# Patient Record
Sex: Male | Born: 2000 | Race: White | Hispanic: No | Marital: Single | Smoking: Current every day smoker
Health system: Southern US, Community
[De-identification: ages and names within clinical notes are randomized; demographics above are authoritative.]

## PROBLEM LIST (undated history)

## (undated) DIAGNOSIS — F1990 Other psychoactive substance use, unspecified, uncomplicated: Secondary | ICD-10-CM

## (undated) HISTORY — PX: TONSILLECTOMY: SUR1361

---
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
## 2022-01-21 ENCOUNTER — Emergency Department (HOSPITAL_BASED_OUTPATIENT_CLINIC_OR_DEPARTMENT_OTHER): Payer: Self-pay | Admitting: Radiology

## 2022-01-21 ENCOUNTER — Other Ambulatory Visit: Payer: Self-pay

## 2022-01-21 ENCOUNTER — Encounter (HOSPITAL_BASED_OUTPATIENT_CLINIC_OR_DEPARTMENT_OTHER): Payer: Self-pay | Admitting: Emergency Medicine

## 2022-01-21 ENCOUNTER — Emergency Department (HOSPITAL_BASED_OUTPATIENT_CLINIC_OR_DEPARTMENT_OTHER): Payer: Self-pay

## 2022-01-21 ENCOUNTER — Emergency Department (HOSPITAL_COMMUNITY)
Admission: EM | Admit: 2022-01-21 | Discharge: 2022-01-21 | Disposition: A | Discharge status: Home / Self Care | Payer: Self-pay | Source: Home / Self Care

## 2022-01-21 ENCOUNTER — Emergency Department (HOSPITAL_BASED_OUTPATIENT_CLINIC_OR_DEPARTMENT_OTHER)
Admission: EM | Admit: 2022-01-21 | Discharge: 2022-01-21 | Disposition: A | Discharge status: Home / Self Care | Payer: Self-pay | Attending: Emergency Medicine | Admitting: Emergency Medicine

## 2022-01-21 DIAGNOSIS — S0121XA Laceration without foreign body of nose, initial encounter: Secondary | ICD-10-CM | POA: Insufficient documentation

## 2022-01-21 DIAGNOSIS — M542 Cervicalgia: Secondary | ICD-10-CM | POA: Diagnosis not present

## 2022-01-21 DIAGNOSIS — M25561 Pain in right knee: Secondary | ICD-10-CM | POA: Diagnosis not present

## 2022-01-21 DIAGNOSIS — M25572 Pain in left ankle and joints of left foot: Secondary | ICD-10-CM | POA: Insufficient documentation

## 2022-01-21 DIAGNOSIS — S0181XA Laceration without foreign body of other part of head, initial encounter: Secondary | ICD-10-CM | POA: Insufficient documentation

## 2022-01-21 DIAGNOSIS — S0990XA Unspecified injury of head, initial encounter: Secondary | ICD-10-CM | POA: Diagnosis present

## 2022-01-21 DIAGNOSIS — M25562 Pain in left knee: Secondary | ICD-10-CM | POA: Insufficient documentation

## 2022-01-21 DIAGNOSIS — Y92410 Unspecified street and highway as the place of occurrence of the external cause: Secondary | ICD-10-CM | POA: Insufficient documentation

## 2022-01-21 DIAGNOSIS — M25571 Pain in right ankle and joints of right foot: Secondary | ICD-10-CM | POA: Insufficient documentation

## 2022-01-21 LAB — CBC
HCT: 41.4 % (ref 39.0–52.0)
Hemoglobin: 14.1 g/dL (ref 13.0–17.0)
MCH: 30.9 pg (ref 26.0–34.0)
MCHC: 34.1 g/dL (ref 30.0–36.0)
MCV: 90.6 fL (ref 80.0–100.0)
Platelets: 292 10*3/uL (ref 150–400)
RBC: 4.57 MIL/uL (ref 4.22–5.81)
RDW: 12.4 % (ref 11.5–15.5)
WBC: 14.2 10*3/uL — ABNORMAL HIGH (ref 4.0–10.5)
nRBC: 0 % (ref 0.0–0.2)

## 2022-01-21 LAB — BASIC METABOLIC PANEL
Anion gap: 9 (ref 5–15)
BUN: 6 mg/dL (ref 6–20)
CO2: 29 mmol/L (ref 22–32)
Calcium: 9.2 mg/dL (ref 8.9–10.3)
Chloride: 104 mmol/L (ref 98–111)
Creatinine, Ser: 0.81 mg/dL (ref 0.61–1.24)
GFR, Estimated: >60 mL/min (ref >60)
Glucose, Bld: 92 mg/dL (ref 70–99)
Potassium: 3.8 mmol/L (ref 3.5–5.1)
Sodium: 142 mmol/L (ref 135–145)

## 2022-01-21 MED ORDER — BACITRACIN ZINC 500 UNIT/GM EX OINT
TOPICAL_OINTMENT | Freq: Two times a day (BID) | CUTANEOUS | Status: DC
  Administered 2022-01-21: 1 via TOPICAL

## 2022-01-21 MED ORDER — LIDOCAINE HCL (PF) 1 % IJ SOLN
5.0000 mL | Freq: Once | INTRAMUSCULAR | Status: AC
  Administered 2022-01-21: 5 mL
  Filled 2022-01-21: qty 5

## 2022-01-21 NOTE — Discharge Instructions (Addendum)
At this time there does not appear to be the presence of an emergent medical condition, however there is always the potential for conditions to change. Please read and follow the below instructions.  Please return to the Emergency Department immediately for any new or worsening symptoms. Please be sure to follow up with your Primary Care Provider within one week regarding your visit today; please call their office to schedule an appointment even if you are feeling better for a follow-up visit. The stitches in your face will need to be removed in the next 7 days.  You may have them removed at an urgent care, your primary care doctor's office, by the plastic surgeon or here at the emergency department. Please call the on-call orthopedist Dr. August Saucer for follow-up regarding your knee and ankle pain.  Please use crutches to keep the weight off of your legs is much as possible and to your follow-up.  You may use knee braces or ankle braces to help with support.   Please read the additional information packets attached to your discharge summary.  Go to the nearest Emergency Department immediately if: You have fever or chills You have shortness of breath. You have light-headedness or you faint. You have chest pain. You have abdominal pain, nausea or vomiting You have these eye or vision changes: Sudden vision loss or double vision. Your eye suddenly turns red. The black center of your eye (pupil) is an odd shape or size. You have any of these signs of infection in or around your wound: More redness, swelling, or pain. Fluid or blood. Warmth. Pus or a bad smell.    You have any new/concerning or worsening of symptoms.  Do not take your medicine if  develop an itchy rash, swelling in your mouth or lips, or difficulty breathing; call 911 and seek immediate emergency medical attention if this occurs.  You may review your lab tests and imaging results in their entirety on your MyChart account.  Please  discuss all results of fully with your primary care provider and other specialist at your follow-up visit.  Note: Portions of this text may have been transcribed using voice recognition software. Every effort was made to ensure accuracy; however, inadvertent computerized transcription errors may still be present.
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
## 2022-12-07 ENCOUNTER — Other Ambulatory Visit: Payer: Self-pay

## 2022-12-07 ENCOUNTER — Encounter (HOSPITAL_COMMUNITY): Payer: Self-pay

## 2022-12-07 ENCOUNTER — Emergency Department (HOSPITAL_COMMUNITY)
Admission: EM | Admit: 2022-12-07 | Discharge: 2022-12-08 | Disposition: A | Discharge status: Other Acute Inpatient Hospital | Payer: 59 | Attending: Emergency Medicine | Admitting: Emergency Medicine

## 2022-12-07 DIAGNOSIS — T424X2A Poisoning by benzodiazepines, intentional self-harm, initial encounter: Secondary | ICD-10-CM | POA: Insufficient documentation

## 2022-12-07 DIAGNOSIS — F1410 Cocaine abuse, uncomplicated: Secondary | ICD-10-CM | POA: Diagnosis present

## 2022-12-07 DIAGNOSIS — T50902A Poisoning by unspecified drugs, medicaments and biological substances, intentional self-harm, initial encounter: Secondary | ICD-10-CM | POA: Diagnosis present

## 2022-12-07 DIAGNOSIS — T1491XA Suicide attempt, initial encounter: Secondary | ICD-10-CM

## 2022-12-07 DIAGNOSIS — X838XXA Intentional self-harm by other specified means, initial encounter: Secondary | ICD-10-CM | POA: Insufficient documentation

## 2022-12-07 DIAGNOSIS — F1910 Other psychoactive substance abuse, uncomplicated: Secondary | ICD-10-CM | POA: Insufficient documentation

## 2022-12-07 HISTORY — DX: Other psychoactive substance use, unspecified, uncomplicated: F19.90

## 2022-12-07 LAB — CBC WITH DIFFERENTIAL/PLATELET
Abs Immature Granulocytes: 0.01 10*3/uL (ref 0.00–0.07)
Basophils Absolute: 0 10*3/uL (ref 0.0–0.1)
Basophils Relative: 1 %
Eosinophils Absolute: 0.1 10*3/uL (ref 0.0–0.5)
Eosinophils Relative: 2 %
HCT: 42.3 % (ref 39.0–52.0)
Hemoglobin: 13.9 g/dL (ref 13.0–17.0)
Immature Granulocytes: 0 %
Lymphocytes Relative: 37 %
Lymphs Abs: 2.2 10*3/uL (ref 0.7–4.0)
MCH: 29.6 pg (ref 26.0–34.0)
MCHC: 32.9 g/dL (ref 30.0–36.0)
MCV: 90 fL (ref 80.0–100.0)
Monocytes Absolute: 0.5 10*3/uL (ref 0.1–1.0)
Monocytes Relative: 9 %
Neutro Abs: 3 10*3/uL (ref 1.7–7.7)
Neutrophils Relative %: 51 %
Platelets: 326 10*3/uL (ref 150–400)
RBC: 4.7 MIL/uL (ref 4.22–5.81)
RDW: 12.6 % (ref 11.5–15.5)
WBC: 5.8 10*3/uL (ref 4.0–10.5)
nRBC: 0 % (ref 0.0–0.2)

## 2022-12-07 LAB — COMPREHENSIVE METABOLIC PANEL
ALT: 21 U/L (ref 0–44)
AST: 23 U/L (ref 15–41)
Albumin: 4.3 g/dL (ref 3.5–5.0)
Alkaline Phosphatase: 69 U/L (ref 38–126)
Anion gap: 11 (ref 5–15)
BUN: 17 mg/dL (ref 6–20)
CO2: 25 mmol/L (ref 22–32)
Calcium: 9.1 mg/dL (ref 8.9–10.3)
Chloride: 101 mmol/L (ref 98–111)
Creatinine, Ser: 0.93 mg/dL (ref 0.61–1.24)
GFR, Estimated: >60 mL/min (ref >60)
Glucose, Bld: 92 mg/dL (ref 70–99)
Potassium: 3.5 mmol/L (ref 3.5–5.1)
Sodium: 137 mmol/L (ref 135–145)
Total Bilirubin: 0.9 mg/dL (ref 0.3–1.2)
Total Protein: 7.7 g/dL (ref 6.5–8.1)

## 2022-12-07 LAB — ACETAMINOPHEN LEVEL: Acetaminophen (Tylenol), Serum: <10 ug/mL — ABNORMAL LOW (ref 10–30)

## 2022-12-07 LAB — RAPID URINE DRUG SCREEN, HOSP PERFORMED
Amphetamines: POSITIVE — AB
Barbiturates: NOT DETECTED
Benzodiazepines: POSITIVE — AB
Cocaine: POSITIVE — AB
Opiates: NOT DETECTED
Tetrahydrocannabinol: NOT DETECTED

## 2022-12-07 LAB — ETHANOL: Alcohol, Ethyl (B): <10 mg/dL (ref <10)

## 2022-12-07 LAB — SALICYLATE LEVEL: Salicylate Lvl: <7 mg/dL — ABNORMAL LOW (ref 7.0–30.0)

## 2022-12-07 MED ORDER — NICOTINE 21 MG/24HR TD PT24
21.0000 mg | MEDICATED_PATCH | Freq: Every day | TRANSDERMAL | Status: DC
  Administered 2022-12-08: 21 mg via TRANSDERMAL
  Filled 2022-12-07: qty 1

## 2022-12-07 MED ORDER — MELATONIN 3 MG PO TABS
3.0000 mg | ORAL_TABLET | Freq: Every day | ORAL | Status: DC
  Administered 2022-12-07: 3 mg via ORAL
  Filled 2022-12-07: qty 1

## 2022-12-07 MED ORDER — LORAZEPAM 0.5 MG PO TABS
0.5000 mg | ORAL_TABLET | Freq: Two times a day (BID) | ORAL | Status: DC
  Administered 2022-12-07 – 2022-12-08 (×2): 0.5 mg via ORAL
  Filled 2022-12-07 (×2): qty 1

## 2022-12-07 MED ORDER — NICOTINE POLACRILEX 2 MG MT GUM
2.0000 mg | CHEWING_GUM | OROMUCOSAL | Status: DC | PRN
  Administered 2022-12-07: 2 mg via ORAL
  Filled 2022-12-07: qty 1

## 2022-12-07 MED ORDER — NICOTINE 14 MG/24HR TD PT24
14.0000 mg | MEDICATED_PATCH | Freq: Once | TRANSDERMAL | Status: DC
  Administered 2022-12-07: 14 mg via TRANSDERMAL
  Filled 2022-12-07: qty 1
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
## 2022-12-07 NOTE — ED Triage Notes (Addendum)
Per [employer]:  IVC Brought in by [employer] SI Overdose Xanax 30 pills 1 mg each Fentanyl IV Given Narcan by his girlfriend  This morning Pt told his brother he would continue to take medication to kill himself until the job was done

## 2022-12-07 NOTE — ED Notes (Signed)
Pt using third and last phone call of today. Pt aware.

## 2022-12-07 NOTE — ED Notes (Signed)
Pt used first and second phone call of today. Pt aware of 3 max.

## 2022-12-07 NOTE — ED Provider Notes (Signed)
[place] EMERGENCY DEPARTMENT AT [facility] Provider Note   CSN: [id number] Arrival date & time: 12/07/22  1445     History  Chief Complaint  Patient presents with   IVC   Suicide Attempt   Drug Overdose    Dustin Morgan is a 22 y.o. male.  With a history of polysubstance abuse presenting to the ED under IVC from [facility] police for suicide attempt.  He states he took 30 1 mg Xanax tablets at 2:00 this morning after a verbal altercation with his significant other in an attempt to commit suicide.  Initially attempted to overdose on fentanyl and xylazine but states his significant other gave him Narcan and resuscitated.  This is when he chose to take the Xanax.  He states he did this to prove a point to his significant other.  He reports getting this medication from a [place]  He states the effect of the medicine has worn off and he feels back to his baseline.  Police were called by his family for a well check.  Currently denies feeling suicidal or homicidal.  He denies auditory or visual hallucinations.  He states he injects crack cocaine, heroin, xylazine and fentanyl nearly every day.  He would like to discontinue all illegal drug use as he says it is affecting his life.  Denies any physical complaints at this time.  He states he was given Narcan by his significant other after he took the Xanax.   Drug Overdose       Home Medications Prior to Admission medications   Not on File      Allergies    Fish allergy    Review of Systems   Review of Systems  Psychiatric/Behavioral:  Positive for behavioral problems and sleep disturbance.   All other systems reviewed and are negative.   Physical Exam Updated Vital Signs BP 116/69   Pulse 88   Temp 98.6 F (37 C) (Oral)   Resp 18   Ht 5\' 7"  (1.702 m)   Wt 54.4 kg   SpO2 100%   BMI 18.79 kg/m  Physical Exam Vitals and nursing note reviewed.  Constitutional:      General: He is not in acute  distress.    Appearance: He is well-developed.     Comments: Resting comfortably in bed  HENT:     Head: Normocephalic and atraumatic.  Eyes:     Conjunctiva/sclera: Conjunctivae normal.  Cardiovascular:     Rate and Rhythm: Normal rate and regular rhythm.     Heart sounds: No murmur heard. Pulmonary:     Effort: Pulmonary effort is normal. No respiratory distress.     Breath sounds: Normal breath sounds.  Abdominal:     Palpations: Abdomen is soft.     Tenderness: There is no abdominal tenderness.  Musculoskeletal:        General: No swelling.     Cervical back: Neck supple.  Skin:    General: Skin is warm and dry.     Capillary Refill: Capillary refill takes less than 2 seconds.  Neurological:     General: No focal deficit present.     Mental Status: He is alert and oriented to person, place, and time.  Psychiatric:        Mood and Affect: Mood normal.     Comments: Tearful, converses appropriately     ED Results / Procedures / Treatments   Labs (all labs ordered are listed, but only abnormal  results are displayed) Labs Reviewed  SALICYLATE LEVEL - Abnormal; Notable for the following components:      Result Value   Salicylate Lvl <7.0 (*)    All other components within normal limits  ACETAMINOPHEN LEVEL - Abnormal; Notable for the following components:   Acetaminophen (Tylenol), Serum <10 (*)    All other components within normal limits  COMPREHENSIVE METABOLIC PANEL  ETHANOL  CBC WITH DIFFERENTIAL/PLATELET  RAPID URINE DRUG SCREEN, HOSP PERFORMED    EKG None  Radiology No results found.  Procedures Procedures    Medications Ordered in ED Medications  nicotine (NICODERM CQ - dosed in mg/24 hours) patch 14 mg (has no administration in time range)    ED Course/ Medical Decision Making/ A&P Clinical Course as of 12/07/22 1738  Tue Dec 07, 2022  1540 Spoke with poison control.  They recommend lab work.  If lab work is negative, he will be medically  cleared.  Normal monitoring required as overdose was 13.5 hours prior to arrival [AS]    Clinical Course User Index [AS] Saleh Ulbrich, Edsel Petrin, PA-C                                 Medical Decision Making Amount and/or Complexity of Data Reviewed Labs: ordered.  Risk OTC drugs.  This patient presents to the ED for concern of suicide attempt, this involves an extensive number of treatment options, and is a complaint that carries with it a high risk of complications and morbidity.  My initial workup includes medical clearance, poison control  Additional history obtained from: Nursing notes from this visit. Police provide a portion of the history  I ordered, reviewed and interpreted labs which include: Medical clearance labs.  Negative.  Afebrile, hemodynamically stable.  22 year old male presenting to the ED for evaluation of suicide attempt by overdose.  He is a polysubstance user.  Attempted suicide by opioid and Xanax overdose.  He says he attempted suicide in order to prove a point.  He no longer feels suicidal.  He presents under IVC which was taken out by his parents.  I contacted poison control who recommended screening labs, if no abnormalities he would be medically cleared.  He appears well on my exam.  He is tearful but conversing appropriately.  He denies any other complaints.  Lab workup is reassuring.  EKG without significant abnormalities.  At this time patient is medically cleared for TTS consult.  Appreciate their recommendations.  Patient is in agreement with this plan.  Note: Portions of this report may have been transcribed using voice recognition software. Every effort was made to ensure accuracy; however, inadvertent computerized transcription errors may still be present.        Final Clinical Impression(s) / ED Diagnoses Final diagnoses:  Suicide attempt [facility])  Polysubstance abuse [facility]    Rx / DC Orders ED Discharge Orders     None         Michelle Piper, PA-C 12/07/22 1738    Rolan Bucco, MD 12/08/22 [phone]

## 2022-12-07 NOTE — ED Notes (Signed)
Pt reports "I do not want to die. I just took the Xanax to prove a point to my girlfriend that how she made me feel made me want to do it."

## 2022-12-07 NOTE — ED Notes (Signed)
Patient to room 31. Patient ambulated  to room .  Patient tearful.  Patient oriented to unit and room.

## 2022-12-08 ENCOUNTER — Encounter: Payer: Self-pay | Admitting: Nurse Practitioner

## 2022-12-08 ENCOUNTER — Inpatient Hospital Stay
Admission: AD | Admit: 2022-12-08 | Discharge: 2022-12-13 | DRG: 918 | Disposition: A | Discharge status: Home / Self Care | Payer: 59 | Source: Intra-hospital | Attending: Psychiatry | Admitting: Psychiatry

## 2022-12-08 DIAGNOSIS — F17290 Nicotine dependence, other tobacco product, uncomplicated: Secondary | ICD-10-CM | POA: Diagnosis present

## 2022-12-08 DIAGNOSIS — Y929 Unspecified place or not applicable: Secondary | ICD-10-CM | POA: Diagnosis not present

## 2022-12-08 DIAGNOSIS — R569 Unspecified convulsions: Secondary | ICD-10-CM | POA: Diagnosis present

## 2022-12-08 DIAGNOSIS — T424X2A Poisoning by benzodiazepines, intentional self-harm, initial encounter: Secondary | ICD-10-CM | POA: Diagnosis present

## 2022-12-08 DIAGNOSIS — F1110 Opioid abuse, uncomplicated: Secondary | ICD-10-CM | POA: Diagnosis present

## 2022-12-08 DIAGNOSIS — Z79899 Other long term (current) drug therapy: Secondary | ICD-10-CM

## 2022-12-08 DIAGNOSIS — Z91013 Allergy to seafood: Secondary | ICD-10-CM | POA: Diagnosis not present

## 2022-12-08 DIAGNOSIS — F1410 Cocaine abuse, uncomplicated: Secondary | ICD-10-CM | POA: Diagnosis present

## 2022-12-08 DIAGNOSIS — F411 Generalized anxiety disorder: Secondary | ICD-10-CM | POA: Diagnosis present

## 2022-12-08 DIAGNOSIS — F1123 Opioid dependence with withdrawal: Secondary | ICD-10-CM | POA: Diagnosis present

## 2022-12-08 DIAGNOSIS — F1994 Other psychoactive substance use, unspecified with psychoactive substance-induced mood disorder: Secondary | ICD-10-CM | POA: Diagnosis present

## 2022-12-08 DIAGNOSIS — F329 Major depressive disorder, single episode, unspecified: Secondary | ICD-10-CM | POA: Diagnosis present

## 2022-12-08 DIAGNOSIS — T424X1A Poisoning by benzodiazepines, accidental (unintentional), initial encounter: Secondary | ICD-10-CM | POA: Diagnosis present

## 2022-12-08 DIAGNOSIS — F17210 Nicotine dependence, cigarettes, uncomplicated: Secondary | ICD-10-CM | POA: Diagnosis present

## 2022-12-08 MED ORDER — DIPHENHYDRAMINE HCL 25 MG PO CAPS
50.0000 mg | ORAL_CAPSULE | Freq: Three times a day (TID) | ORAL | Status: DC | PRN
  Administered 2022-12-08 – 2022-12-09 (×2): 50 mg via ORAL
  Filled 2022-12-08 (×2): qty 2

## 2022-12-08 MED ORDER — ENSURE ENLIVE PO LIQD: 1.0000 | Freq: Two times a day (BID) | ORAL | Status: DC

## 2022-12-08 MED ORDER — THIAMINE MONONITRATE 100 MG PO TABS
100.0000 mg | ORAL_TABLET | Freq: Every day | ORAL | Status: DC
  Administered 2022-12-08 – 2022-12-13 (×6): 100 mg via ORAL
  Filled 2022-12-08 (×6): qty 1

## 2022-12-08 MED ORDER — HYDROXYZINE HCL 25 MG PO TABS
25.0000 mg | ORAL_TABLET | Freq: Three times a day (TID) | ORAL | Status: DC | PRN
Start: 2022-12-08 — End: 2022-12-08

## 2022-12-08 MED ORDER — LORAZEPAM 1 MG PO TABS
1.0000 mg | ORAL_TABLET | ORAL | Status: AC | PRN
  Administered 2022-12-09 – 2022-12-10 (×4): 1 mg via ORAL
  Filled 2022-12-08 (×3): qty 1

## 2022-12-08 MED ORDER — CLONAZEPAM 0.25 MG PO TBDP
1.0000 mg | ORAL_TABLET | Freq: Two times a day (BID) | ORAL | Status: DC
  Administered 2022-12-08 – 2022-12-10 (×4): 1 mg via ORAL
  Filled 2022-12-08 (×5): qty 4

## 2022-12-08 MED ORDER — GABAPENTIN 300 MG PO CAPS
300.0000 mg | ORAL_CAPSULE | Freq: Three times a day (TID) | ORAL | Status: DC
  Administered 2022-12-08 – 2022-12-13 (×15): 300 mg via ORAL
  Filled 2022-12-08 (×15): qty 1

## 2022-12-08 MED ORDER — MAGNESIUM HYDROXIDE 400 MG/5ML PO SUSP: 30.0000 mL | Freq: Every day | ORAL | Status: DC | PRN

## 2022-12-08 MED ORDER — ACETAMINOPHEN 325 MG PO TABS
650.0000 mg | ORAL_TABLET | Freq: Four times a day (QID) | ORAL | Status: DC | PRN
  Administered 2022-12-10: 650 mg via ORAL
  Filled 2022-12-08: qty 2

## 2022-12-08 MED ORDER — ADULT MULTIVITAMIN W/MINERALS CH
1.0000 | ORAL_TABLET | Freq: Every day | ORAL | Status: DC
  Administered 2022-12-08 – 2022-12-13 (×6): 1 via ORAL
  Filled 2022-12-08 (×6): qty 1

## 2022-12-08 MED ORDER — ONDANSETRON HCL 4 MG PO TABS: 8.0000 mg | ORAL_TABLET | Freq: Three times a day (TID) | ORAL | Status: DC | PRN

## 2022-12-08 MED ORDER — LORAZEPAM 2 MG PO TABS: 0.0000 mg | ORAL_TABLET | Freq: Four times a day (QID) | ORAL | Status: DC | PRN

## 2022-12-08 MED ORDER — BUPRENORPHINE HCL-NALOXONE HCL 8-2 MG SL SUBL
1.0000 | SUBLINGUAL_TABLET | Freq: Three times a day (TID) | SUBLINGUAL | Status: DC
  Administered 2022-12-08 – 2022-12-13 (×15): 1 via SUBLINGUAL
  Filled 2022-12-08 (×15): qty 1

## 2022-12-08 MED ORDER — FOLIC ACID 1 MG PO TABS
1.0000 mg | ORAL_TABLET | Freq: Every day | ORAL | Status: DC
  Administered 2022-12-08 – 2022-12-13 (×6): 1 mg via ORAL
  Filled 2022-12-08 (×6): qty 1

## 2022-12-08 MED ORDER — LORAZEPAM 1 MG PO TABS
1.0000 mg | ORAL_TABLET | ORAL | Status: AC | PRN
  Filled 2022-12-08 (×2): qty 1

## 2022-12-08 MED ORDER — ALUM & MAG HYDROXIDE-SIMETH 200-200-20 MG/5ML PO SUSP: 30.0000 mL | ORAL | Status: DC | PRN

## 2022-12-08 MED ORDER — LORAZEPAM 2 MG PO TABS
0.0000 mg | ORAL_TABLET | Freq: Four times a day (QID) | ORAL | Status: DC
Start: 2022-12-08 — End: 2022-12-10
  Administered 2022-12-08: 2 mg via ORAL
  Administered 2022-12-08: 1 mg via ORAL
  Administered 2022-12-09: 2 mg via ORAL
  Administered 2022-12-09: 1 mg via ORAL
  Filled 2022-12-08 (×3): qty 1

## 2022-12-08 MED ORDER — THIAMINE HCL 100 MG/ML IJ SOLN: 100.0000 mg | Freq: Every day | INTRAMUSCULAR | Status: DC

## 2022-12-08 MED ORDER — LAMOTRIGINE 25 MG PO TABS
25.0000 mg | ORAL_TABLET | Freq: Two times a day (BID) | ORAL | Status: DC
  Administered 2022-12-08 – 2022-12-13 (×10): 25 mg via ORAL
  Filled 2022-12-08 (×10): qty 1

## 2022-12-08 MED ORDER — DIPHENHYDRAMINE HCL 50 MG/ML IJ SOLN: 50.0000 mg | Freq: Three times a day (TID) | INTRAMUSCULAR | Status: DC | PRN

## 2022-12-08 MED ORDER — NICOTINE POLACRILEX 2 MG MT GUM
2.0000 mg | CHEWING_GUM | OROMUCOSAL | Status: DC | PRN
  Administered 2022-12-09 – 2022-12-10 (×5): 2 mg via ORAL
  Filled 2022-12-08 (×5): qty 1

## 2022-12-08 NOTE — Progress Notes (Signed)
Pt calm and pleasant during assessment denying SI/HI/AVH. Pt stated he was feeling better tonight with his withdrawals systems. Pt compliant with medication administration per MD orders. Pt given education, support, and encouragement to be active in his treatment plan. Pt being monitored Q 15 minutes for safety per unit protocol, remains safe on the unit

## 2022-12-08 NOTE — Tx Team (Signed)
Initial Treatment Plan 12/08/2022 2:19 PM [name] MVH:[id number]    PATIENT STRESSORS: Substance abuse     PATIENT STRENGTHS: Supportive family/friends    PATIENT IDENTIFIED PROBLEMS: Substance abuse  Family/relationship conflict                   DISCHARGE CRITERIA:  Improved stabilization in mood, thinking, and/or behavior Withdrawal symptoms are absent or subacute and managed without 24-hour nursing intervention  PRELIMINARY DISCHARGE PLAN: Return to previous living arrangement  PATIENT/FAMILY INVOLVEMENT: This treatment plan has been presented to and reviewed with the patient, Dustin Morgan.  The patient has been given the opportunity to ask questions and make suggestions.  Roseanne Reno, RN 12/08/2022, 2:19 PM

## 2022-12-08 NOTE — Group Note (Signed)
Date:  12/08/2022 Time:  9:14 PM  Group Topic/Focus:  Stages of Change:   The focus of this group is to explain the stages of change and help patients identify changes they want to make upon discharge.    Participation Level:  Active  Participation Quality:  Appropriate and Attentive  Affect:  Appropriate  Cognitive:  Alert and Appropriate  Insight: Appropriate, Good, and Improving  Engagement in Group:  Developing/Improving and Engaged  Modes of Intervention:  Clarification, Discussion, Education, Rapport Building, Socialization, and Support  Additional Comments:     Dustin Morgan 12/08/2022, 9:14 PM
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
## 2022-12-08 NOTE — Group Note (Signed)
Date:  12/08/2022 Time:  6:57 PM  Group Topic/Focus:  Wellness Toolbox:   The focus of this group is to discuss various aspects of wellness, balancing those aspects and exploring ways to increase the ability to experience wellness.  Patients will create a wellness toolbox for use upon discharge.    Participation Level:  Minimal   Participation Quality:  Appropriate  Affect:  Appropriate  Cognitive:  Appropriate  Insight: Appropriate  Engagement in Group:  Engaged  Modes of Intervention:  Activity  Additional Comments:    Wilford Corner 12/08/2022, 6:57 PM

## 2022-12-08 NOTE — H&P (Signed)
Psychiatric Admission Assessment Adult  Patient Identification: Dustin Morgan MRN:  [id number] Date of Evaluation:  12/08/2022 Chief Complaint:  Suicide attempt by benzodiazepine overdose [facility]) [T42.4X2A] Principal Diagnosis: Suicide attempt by benzodiazepine overdose (HCC) Diagnosis:  Principal Problem:   Suicide attempt by benzodiazepine overdose (HCC)  History of Present Illness: 22 year old Caucasian male with a history of polysubstance use disorder, presenting under involuntary commitment (IVC) after a suicide attempt involving a drug overdose. The patient reports taking 30 tablets of 1 mg Xanax early this morning (approximately 2:00 AM) after a verbal altercation with his significant other, describing it as an attempt to "prove a point" and end his life. He initially attempted to overdose on fentanyl and xylazine but was resuscitated by his significant other with Narcan. Following this, he ingested the Xanax. He states he obtained the medication from an "[place]" and now feels the drug's effects have worn off, returning him to his baseline.Upon arrival, the patient displayed symptoms of benzodiazepine withdrawal, including rhinorrhea, dilated pupils, and head and hand tremors. During observation, he experienced a focal seizure lasting approximately 10 seconds, with a brief loss of alertness but quick recovery. Dustin Morgan admits to injecting substances such as crack cocaine, heroin, xylazine, and fentanyl nearly every day, though he now expresses a desire to discontinue illegal drug use, acknowledging the negative impact on his life. He denies current suicidal or homicidal ideation, auditory or visual hallucinations, and any other acute medical complaints.Patient  has been on Suboxone, though he reports ongoing daily injections of crack cocaine, heroin, xylazine, and fentanyl. He now expresses interest in discontinuing all illegal drug use.  Associated Signs/Symptoms: Depression Symptoms:   insomnia, feelings of worthlessness/guilt, hopelessness, suicidal thoughts with specific plan, suicidal attempt, anxiety, decreased appetite, (Hypo) Manic Symptoms:  Impulsivity, Anxiety Symptoms:  Excessive Worry, Psychotic Symptoms:   none noted PTSD Symptoms: Negative Total Time spent with patient: 3 hours  Past Psychiatric History: Polysubstance Abuse   Is the patient at risk to self? Yes.    Has the patient been a risk to self in the past 6 months? Yes.    Has the patient been a risk to self within the distant past? Yes.    Is the patient a risk to others? No.  Has the patient been a risk to others in the past 6 months? No.  Has the patient been a risk to others within the distant past? No.   [place] Scale:  Flowsheet Row ED from 12/07/2022 in [facility] Emergency Department at [facility] ED from [facility]/21/2022 in [facility]  C-SSRS RISK CATEGORY High Risk No Risk        Prior Inpatient Therapy: Yes.   If yes, describe   Prior Outpatient Therapy: Yes.   If yes, describe outpatient   Alcohol Screening:   Substance Abuse History in the last 12 months:  Yes.   Consequences of Substance Abuse: Family Consequences:  no support DT's: current Withdrawal Symptoms:   Cramps Headaches Tremors Vomiting rhinorrhea Previous Psychotropic Medications: Yes  Psychological Evaluations: Yes  Past Medical History:  Past Medical History:  Diagnosis Date   IV drug user     Past Surgical History:  Procedure Laterality Date   TONSILLECTOMY     Family History: No family history on file. Family Psychiatric  History: none noted Tobacco Screening:  Social History   Tobacco Use  Smoking Status Every Day   Current packs/day: 1.50   Types: Cigarettes  Smokeless Tobacco Never  BH Tobacco Counseling     Are you interested in Tobacco Cessation Medications?  No value filed. Counseled patient on smoking cessation:  No value  filed. Reason Tobacco Screening Not Completed: No value filed.       Social History:  Social History   Substance and Sexual Activity  Alcohol Use Yes   Comment: occ     Social History   Substance and Sexual Activity  Drug Use Yes   Types: Marijuana, IV   Comment: crack (smoke), fentanyl (IV), heroine (IV)    Additional Social History:                           Allergies:   Allergies  Allergen Reactions   Fish Allergy Other (See Comments)    Intolerance   Lab Results:  Results for orders placed or performed during the hospital encounter of 12/07/22 (from the past 48 hour(s))  Comprehensive metabolic panel     Status: None   Collection Time: 12/07/22  4:18 PM  Result Value Ref Range   Sodium 137 135 - 145 mmol/L   Potassium 3.5 3.5 - 5.1 mmol/L   Chloride 101 98 - 111 mmol/L   CO2 25 22 - 32 mmol/L   Glucose, Bld 92 70 - 99 mg/dL    Comment: Glucose reference range applies only to samples taken after fasting for at least 8 hours.   BUN 17 6 - 20 mg/dL   Creatinine, Ser 6.04 0.61 - 1.24 mg/dL   Calcium 9.1 8.9 - 54.0 mg/dL   Total Protein 7.7 6.5 - 8.1 g/dL   Albumin 4.3 3.5 - 5.0 g/dL   AST 23 15 - 41 U/L   ALT 21 0 - 44 U/L   Alkaline Phosphatase 69 38 - 126 U/L   Total Bilirubin 0.9 0.3 - 1.2 mg/dL   GFR, Estimated >98 >11 mL/min    Comment: (NOTE) Calculated using the CKD-EPI Creatinine Equation (2021)    Anion gap 11 5 - 15    Comment: Performed at [facility], [address], [place], [place] [address]  Ethanol     Status: None   Collection Time: 12/07/22  4:18 PM  Result Value Ref Range   Alcohol, Ethyl (B) <10 <10 mg/dL    Comment: (NOTE) Lowest detectable limit for serum alcohol is 10 mg/dL.  For medical purposes only. Performed at [facility], [address], [place], [place] [address]   CBC with Diff     Status: None   Collection Time: 12/07/22  4:18 PM  Result Value Ref Range   WBC 5.8 4.0  - 10.5 K/uL   RBC 4.70 4.22 - 5.81 MIL/uL   Hemoglobin 13.9 13.0 - 17.0 g/dL   HCT 13.0 86.5 - 78.4 %   MCV 90.0 80.0 - 100.0 fL   MCH 29.6 26.0 - 34.0 pg   MCHC 32.9 30.0 - 36.0 g/dL   RDW 69.6 29.5 - 28.4 %   Platelets 326 150 - 400 K/uL   nRBC 0.0 0.0 - 0.2 %   Neutrophils Relative % 51 %   Neutro Abs 3.0 1.7 - 7.7 K/uL   Lymphocytes Relative 37 %   Lymphs Abs 2.2 0.7 - 4.0 K/uL   Monocytes Relative 9 %   Monocytes Absolute 0.5 0.1 - 1.0 K/uL   Eosinophils Relative 2 %   Eosinophils Absolute 0.1 0.0 - 0.5 K/uL   Basophils Relative 1 %  Basophils Absolute 0.0 0.0 - 0.1 K/uL   Immature Granulocytes 0 %   Abs Immature Granulocytes 0.01 0.00 - 0.07 K/uL    Comment: Performed at [facility], [address] [place], [place] [address]  Salicylate level     Status: Abnormal   Collection Time: 12/07/22  4:18 PM  Result Value Ref Range   Salicylate Lvl <7.0 (L) 7.0 - 30.0 mg/dL    Comment: Performed at [facility], [address] [place], [place] [address]  Acetaminophen level     Status: Abnormal   Collection Time: 12/07/22  4:18 PM  Result Value Ref Range   Acetaminophen (Tylenol), Serum <10 (L) 10 - 30 ug/mL    Comment: (NOTE) Therapeutic concentrations vary significantly. A range of 10-30 ug/mL  may be an effective concentration for many patients. However, some  are best treated at concentrations outside of this range. Acetaminophen concentrations >150 ug/mL at 4 hours after ingestion  and >50 ug/mL at 12 hours after ingestion are often associated with  toxic reactions.  Performed at [facility], [address], [place], [place] [address]   Urine rapid drug screen (hosp performed)     Status: Abnormal   Collection Time: 12/07/22  5:28 PM  Result Value Ref Range   Opiates NONE DETECTED NONE DETECTED   Cocaine POSITIVE (A) NONE DETECTED   Benzodiazepines POSITIVE (A) NONE DETECTED   Amphetamines POSITIVE (A)  NONE DETECTED   Tetrahydrocannabinol NONE DETECTED NONE DETECTED   Barbiturates NONE DETECTED NONE DETECTED    Comment: (NOTE) DRUG SCREEN FOR MEDICAL PURPOSES ONLY.  IF CONFIRMATION IS NEEDED FOR ANY PURPOSE, NOTIFY LAB WITHIN 5 DAYS.  LOWEST DETECTABLE LIMITS FOR URINE DRUG SCREEN Drug Class                     Cutoff (ng/mL) Amphetamine and metabolites    1000 Barbiturate and metabolites    200 Benzodiazepine                 200 Opiates and metabolites        300 Cocaine and metabolites        300 THC                            50 Performed at [facility], [address], [place], [place] [address]     Blood Alcohol level:  Lab Results  Component Value Date   ETH <10 12/07/2022    Metabolic Disorder Labs:  No results found for: "HGBA1C", "MPG" No results found for: "PROLACTIN" No results found for: "CHOL", "TRIG", "HDL", "CHOLHDL", "VLDL", "LDLCALC"  Current Medications: Current Facility-Administered Medications  Medication Dose Route Frequency Provider Last Rate Last Admin   acetaminophen (TYLENOL) tablet 650 mg  650 mg Oral Q6H PRN Onuoha, Chinwendu V, NP       alum & mag hydroxide-simeth (MAALOX/MYLANTA) 200-200-20 MG/5ML suspension 30 mL  30 mL Oral Q4H PRN Onuoha, Chinwendu V, NP       clonazePAM (KLONOPIN) disintegrating tablet 1 mg  1 mg Oral BID Myriam Forehand, NP   1 mg at 12/08/22 1249   diphenhydrAMINE (BENADRYL) capsule 50 mg  50 mg Oral TID PRN Onuoha, Chinwendu V, NP       Or   diphenhydrAMINE (BENADRYL) injection 50 mg  50 mg Intramuscular TID PRN Onuoha, Chinwendu V, NP       folic  acid (FOLVITE) tablet 1 mg  1 mg Oral Daily Myriam Forehand, NP       lamoTRIgine (LAMICTAL) tablet 25 mg  25 mg Oral BID Myriam Forehand, NP       LORazepam (ATIVAN) tablet 0-4 mg  0-4 mg Oral Q6H Myriam Forehand, NP       Followed by   Melene Muller ON 12/10/2022] LORazepam (ATIVAN) tablet 0-4 mg  0-4 mg Oral Q6H PRN Myriam Forehand, NP       LORazepam (ATIVAN) tablet  1-4 mg  1-4 mg Oral Q1H PRN Myriam Forehand, NP       Or   LORazepam (ATIVAN) tablet 1 mg  1 mg Oral Q1H PRN Myriam Forehand, NP       magnesium hydroxide (MILK OF MAGNESIA) suspension 30 mL  30 mL Oral Daily PRN Onuoha, Chinwendu V, NP       multivitamin with minerals tablet 1 tablet  1 tablet Oral Daily Myriam Forehand, NP       nicotine polacrilex (NICORETTE) gum 2 mg  2 mg Oral PRN Myriam Forehand, NP       ondansetron [facility]) tablet 8 mg  8 mg Oral Q8H PRN Myriam Forehand, NP       thiamine (VITAMIN B1) tablet 100 mg  100 mg Oral Daily Myriam Forehand, NP       PTA Medications: Medications Prior to Admission  Medication Sig Dispense Refill Last Dose   Buprenorphine HCl-Naloxone HCl 8-2 MG FILM Place 1 Film under the tongue 3 (three) times daily.      cloNIDine (CATAPRES) 0.1 MG tablet Take 0.1 mg by mouth 3 (three) times daily as needed. (Patient not taking: Reported on 12/07/2022)      gabapentin (NEURONTIN) 300 MG capsule Take 300 mg by mouth 3 (three) times daily.      ondansetron (ZOFRAN-ODT) 8 MG disintegrating tablet Take 8 mg by mouth 3 (three) times daily.       Musculoskeletal: Strength & Muscle Tone: within normal limits Gait & Station: normal Patient leans: N/A            Psychiatric Specialty Exam:  Presentation  General Appearance:  Fairly Groomed  Eye Contact: Minimal  Speech: Slurred  Speech Volume: Decreased  Handedness: Right   Mood and Affect  Mood: Anxious  Affect: Flat; Blunt   Thought Process  Thought Processes: Coherent  Duration of Psychotic Symptoms: none noted Past Diagnosis of Schizophrenia or Psychoactive disorder: No data recorded Descriptions of Associations:Tangential  Orientation:Full (Time, Place and Person) (and situation)  Thought Content:WDL  Hallucinations:Hallucinations: None  Ideas of Reference:None  Suicidal Thoughts:Suicidal Thoughts: Yes, Passive SI Passive Intent and/or Plan: Without Intent; Without Plan;  Without Means to Carry Out; Without Access to Means  Homicidal Thoughts:Homicidal Thoughts: No   Sensorium  Memory: Immediate Fair; Recent Poor  Judgment: Poor  Insight: Poor   Executive Functions  Concentration: Fair  Attention Span: Poor  Recall: Fair  Fund of Knowledge: Good  Language: Good   Psychomotor Activity  Psychomotor Activity: Psychomotor Activity: Normal   Assets  Assets: Communication Skills   Sleep  Sleep: Sleep: Poor Number of Hours of Sleep: 4    Physical Exam: Physical Exam HENT:     Head: Normocephalic and atraumatic.     Nose: Rhinorrhea present.  Pulmonary:     Effort: Pulmonary effort is normal.  Musculoskeletal:        General: Normal range of motion.  Cervical back: Normal range of motion.  Neurological:     Mental Status: He is alert and oriented to person, place, and time. Mental status is at baseline.     Motor: Seizure activity present.     Coordination: Coordination is intact.  Psychiatric:        Attention and Perception: Attention and perception normal.        Mood and Affect: Affect is blunt and flat.        Speech: Speech is slurred.        Behavior: Behavior is slowed. Behavior is cooperative.        Thought Content: Thought content includes suicidal ideation. Thought content includes suicidal plan.        Cognition and Memory: Cognition is impaired.        Judgment: Judgment is impulsive.    Review of Systems  HENT:  Positive for congestion.   Neurological:  Positive for seizures.  Psychiatric/Behavioral:  Positive for substance abuse and suicidal ideas.   All other systems reviewed and are negative.  Blood pressure 124/81, pulse 100, temperature 98.3 F (36.8 C), temperature source Oral, resp. rate 18, height 5\' 7"  (1.702 m), weight 50.3 kg, SpO2 100%. Body mass index is 17.35 kg/m.  Treatment Plan Summary: Daily contact with patient to assess and evaluate symptoms and progress in treatment and  Medication management  Observation Level/Precautions:  Continuous Observation Detox 15 minute checks Seizure  Laboratory:   none   Psychotherapy:    Medications:  klonopin lamtical  Consultations:  medical  Discharge Concerns:    Estimated LOS:  Other:  CIWA   Physician Treatment Plan for Primary Diagnosis: Suicide attempt by benzodiazepine overdose (HCC) Long Term Goal(s): Improvement in symptoms so as ready for discharge  Short Term Goals: Ability to identify changes in lifestyle to reduce recurrence of condition will improve, Ability to verbalize feelings will improve, Ability to disclose and discuss suicidal ideas, Ability to demonstrate self-control will improve, Ability to identify and develop effective coping behaviors will improve, Ability to maintain clinical measurements within normal limits will improve, Compliance with prescribed medications will improve, and Ability to identify triggers associated with substance abuse/mental health issues will improve  Physician Treatment Plan for Secondary Diagnosis: Principal Problem:   Suicide attempt by benzodiazepine overdose (HCC)  Long Term Goal(s): Improvement in symptoms so as ready for discharge  Short Term Goals: Ability to identify changes in lifestyle to reduce recurrence of condition will improve, Ability to verbalize feelings will improve, Ability to disclose and discuss suicidal ideas, Ability to demonstrate self-control will improve, Ability to identify and develop effective coping behaviors will improve, Ability to maintain clinical measurements within normal limits will improve, Compliance with prescribed medications will improve, and Ability to identify triggers associated with substance abuse/mental health issues will improve  I certify that inpatient services furnished can reasonably be expected to improve the patient's condition.   Obtain a consult with MD to evaluate seizure activity, monitor for further episodes, and  assess need for additional interventions. Lamictal 50 mg twice daily for mood stabilization and seizure prophylaxis; administer one tablet now. Klonopin 1 mg twice daily for withdrawal symptom management; administer one tablet now. CIWA protocol for benzodiazepine withdrawal monitoring with Ativan as needed for symptoms. Continue close observation for signs of withdrawal, mental status changes, and safety monitoring due to high overdose risk. Monitor vital signs frequently, and observe for any further seizure activity.  Will Reorder Suboxone at this time Myriam Forehand, NP  10/30/20241:34 PM

## 2022-12-08 NOTE — ED Notes (Signed)
Pt was accepted to [facility]U TODAY 12/08/2022; Bed Assignment 322 Address: [address] [place], [place], [place] [address] BMU FAX Number [phone]  [id number] attempt by benzodiazepine overdose  OD (overdose of drug), intentional self-harm.   Per [facility] AC,Please pre admit this patient.   Pt meets inpatient criteria per Earney Navy, NP-PMHNP-BC   Attending Physician will be Dr. Shellee Milo  Report can be called to: [phone]  Pt can arrive after: [facility] and [facility] BMU Charge RN will coordinate with care team.   Calling Sheriff at this time to leave message on their answering service for transport to [facility].

## 2022-12-08 NOTE — Progress Notes (Signed)
Pt was accepted to [facility] BMU TODAY 12/08/2022; Bed Assignment 322 Address: [address] [place], [place], [place] [address] BMU FAX Number [phone]  [id number] attempt by benzodiazepine overdose  OD (overdose of drug), intentional self-harm.   Per [facility] AC,Please pre admit this patient.   Pt meets inpatient criteria per Earney Navy, NP-PMHNP-BC   Attending Physician will be Dr. Shellee Milo  Report can be called to: [phone]  Pt can arrive after: [facility]U Charge RN will coordinate with care team.  Care Team notified:Night [facility] Herold Harms, Mancel Bale, NP, [facility] Delila Spence Carmin Richmond, MSW, LCSWA 12/08/2022 1:00 AM

## 2022-12-08 NOTE — BHH Suicide Risk Assessment (Signed)
[facility] Admission Suicide Risk Assessment   Nursing information obtained from:    Demographic factors:    Current Mental Status:    Loss Factors:    Historical Factors:    Risk Reduction Factors:     Total Time spent with patient: 3 hours Principal Problem: Suicide attempt by benzodiazepine overdose (HCC) Diagnosis:  Principal Problem:   Suicide attempt by benzodiazepine overdose (HCC)  Subjective Data:  22 year old Caucasian male with a history of polysubstance abuse presenting under involuntary commitment (IVC) following a suicide attempt via drug overdose. The patient reports taking 30 tablets of 1 mg Xanax around 2:00 AM after a verbal altercation with his significant other, in an effort to commit suicide. He initially attempted to overdose on fentanyl and xylazine, stating his significant other resuscitated him with Narcan. He denies current suicidal or homicidal ideation and has no auditory or visual hallucinations. He admits to daily use of injected substances, including crack cocaine, heroin, xylazine, and fentanyl, but expresses a desire to discontinue illegal drug use due to its impact on his life. He reports feeling back to his baseline after the overdose.The patient currently under IVC for suicide attempt via drug overdose. Displays withdrawal symptoms, seizure activity, and symptoms consistent with benzodiazepine withdrawal (head and hand tremors, rhinorrhea, dilated pupils). Expresses interest in discontinuing substance use. Denies current SI/HI but remains high risk given recent attempt and history of daily substance injection.    The "Alcohol Use Disorders Identification Test", Guidelines for Use in Primary Care, Second Edition.  World Science writer [facility]). Score between 0-7:  no or low risk or alcohol related problems. Score between 8-15:  moderate risk of alcohol related problems. Score between 16-19:  high risk of alcohol related problems. Score 20 or above:  warrants  further diagnostic evaluation for alcohol dependence and treatment.   CLINICAL FACTORS:   Depression:   Comorbid alcohol abuse/dependence Hopelessness Impulsivity Alcohol/Substance Abuse/Dependencies Unstable or Poor Therapeutic Relationship   Musculoskeletal: Strength & Muscle Tone: within normal limits Gait & Station: normal Patient leans: N/A  Psychiatric Specialty Exam:  Presentation  General Appearance:  Fairly Groomed  Eye Contact: Minimal  Speech: Slurred  Speech Volume: Decreased  Handedness: Right   Mood and Affect  Mood: Anxious  Affect: Flat; Blunt   Thought Process  Thought Processes: Coherent  Descriptions of Associations:Tangential  Orientation:Full (Time, Place and Person) (and situation)  Thought Content:WDL  History of Schizophrenia/Schizoaffective disorder:none noted Duration of Psychotic Symptoms: none noted Hallucinations:Hallucinations: None  Ideas of Reference:None  Suicidal Thoughts:Suicidal Thoughts: Yes, Passive SI Passive Intent and/or Plan: Without Intent; Without Plan; Without Means to Carry Out; Without Access to Means  Homicidal Thoughts:Homicidal Thoughts: No   Sensorium  Memory: Immediate Fair; Recent Poor  Judgment: Poor  Insight: Poor   Executive Functions  Concentration: Fair  Attention Span: Poor  Recall: Fair  Fund of Knowledge: Good  Language: Good   Psychomotor Activity  Psychomotor Activity: Psychomotor Activity: Normal   Assets  Assets: Communication Skills   Sleep  Sleep: Sleep: Poor Number of Hours of Sleep: 4    Physical Exam: Physical Exam Vitals and nursing note reviewed.  Constitutional:      Appearance: He is underweight. He is ill-appearing.  HENT:     Head: Normocephalic and atraumatic.     Nose: Rhinorrhea present.  Cardiovascular:     Rate and Rhythm: Tachycardia present.  Pulmonary:     Effort: Pulmonary effort is normal.  Musculoskeletal:  General: Normal range of motion.  Neurological:     Mental Status: He is alert and oriented to person, place, and time. Mental status is at baseline.     Motor: Seizure activity present.     Coordination: Coordination is intact.  Psychiatric:        Attention and Perception: Perception normal. He is inattentive.        Mood and Affect: Affect is blunt and flat.        Speech: Speech is slurred.        Behavior: Behavior is slowed. Behavior is cooperative.        Thought Content: Thought content normal.        Cognition and Memory: Cognition is impaired.        Judgment: Judgment is impulsive.    ROS Blood pressure 124/81, pulse 100, temperature 98.3 F (36.8 C), temperature source Oral, resp. rate 18, height 5\' 7"  (1.702 m), weight 50.3 kg, SpO2 100%. Body mass index is 17.35 kg/m.   COGNITIVE FEATURES THAT CONTRIBUTE TO RISK:  None    SUICIDE RISK:   Moderate:  Frequent suicidal ideation with limited intensity, and duration, some specificity in terms of plans, no associated intent, good self-control, limited dysphoria/symptomatology, some risk factors present, and identifiable protective factors, including available and accessible social support.  PLAN OF CARE:  Obtain a consult with MD to evaluate seizure activity, monitor for further episodes, and assess need for additional interventions. Lamictal 50 mg twice daily for mood stabilization and seizure prophylaxis; administer one tablet now. Klonopin 1 mg twice daily for withdrawal symptom management; administer one tablet now. CIWA protocol for benzodiazepine withdrawal monitoring with Ativan as needed for symptoms. Continue close observation for signs of withdrawal, mental status changes, and safety monitoring due to high overdose risk. Monitor vital signs frequently, and observe for any further seizure activity.   I certify that inpatient services furnished can reasonably be expected to improve the patient's condition.    Myriam Forehand, NP 12/08/2022, 1:27 PM

## 2022-12-08 NOTE — Group Note (Signed)
Recreation Therapy Group Note   Group Topic:Coping Skills  Group Date: 12/08/2022 Start Time: 1010 End Time: 1110 Facilitators: Rosina Lowenstein, LRT, CTRS Location:  Craft Room  Group Description: Mind Map.  Patient was provided a blank template of a diagram with 32 blank boxes in a tiered system, branching from the center (similar to a bubble chart). LRT directed patients to label the middle of the diagram "Coping Skills". LRT and patients then came up with 8 different coping skills as examples. Pt were directed to record their coping skills in the 2nd tier boxes closest to the center.  Patients would then share their coping skills with the group as LRT wrote them out. LRT gave a handout of 99 different coping skills at the end of group.   Goal Area(s) Addressed: Patients will be able to define "coping skills". Patient will identify new coping skills.  Patient will increase communication.   Affect/Mood: N/A   Participation Level: Did not attend    Clinical Observations/Individualized Feedback: Pt did not attend group due to not being on the unit yet.   Plan: Continue to engage patient in RT group sessions 2-3x/week.   Rosina Lowenstein, LRT, CTRS 12/08/2022 11:27 AM

## 2022-12-08 NOTE — Consult Note (Signed)
Initial Consultation Note   Patient: Dustin Morgan MWN:[id number] DOB: [birth year] PCP: Patient, No Pcp Per DOA: 12/08/2022 DOS: the patient was seen and examined on 12/08/2022 Primary service: Sarina Ill  Referring physician: Keith Rake Reason for consult: Seizure  Assessment/Plan:  Seizure versus pseudoseizure -Seizure-like symptoms could be from most likely, withdrawal seizures from benzo/Xanax.  Or can be part of the systemic withdrawal symptoms from opioid. -As patient already on 2 times daily Klonopin, by primary team, no further benzo tapering needed at this point. -Agreed with lamotrigine if it also helps with patient's psychiatry issue. -Agreed with CIWA protocol with as needed benzos -Continue seizure precautions -I do not see any strong indication for further inpatient seizure workup at this point.  Opioid withdrawal -Check [place] substance control website, confirmed that patient has been on Suboxone 8/2 3 times daily since at least August this year.  Will reorder Suboxone. -Continue the as needed withdrawal medication according to CIWA protocol.  Major depression and suicidal attempt -As per primary team    TRH will continue to follow the patient.  HPI: Dustin Morgan is a 22 y.o. male with past medical history of polysubstance abuse including fentanyl, cocaine, amphetamine and benzos presented with suicidal attempt with drug overdose.  2 nights ago patient ingested 30 pills of 1 mg Xanax and unknown amount of IV fentanyl, and was found unresponsive by girlfriend who administered intranasal Narcan and called EMS.  This morning, while attending to her group therapy, patient started to have sweaty tremors runny nose headache and anxious cramping abdominal pain.  Reportedly patient was having a seizure-like movement at the moment for short period time and accompanied by nursing staff back to his bed.  Patient was given p.o. Klonopin.  At the time I saw the  patient patient continued to experience chills, cramping abdominal pain and sweaty.  No nauseous vomiting no diarrhea.  Review of Systems: As mentioned in the history of present illness. All other systems reviewed and are negative. Past Medical History:  Diagnosis Date   IV drug user    Past Surgical History:  Procedure Laterality Date   TONSILLECTOMY     Social History:  reports that he has been smoking cigarettes. He has never used smokeless tobacco. He reports current alcohol use. He reports current drug use. Drugs: Marijuana and IV.  Allergies  Allergen Reactions   Fish Allergy Other (See Comments)    Intolerance    No family history on file.  Prior to Admission medications   Medication Sig Start Date End Date Taking? Authorizing Provider  Buprenorphine HCl-Naloxone HCl 8-2 MG FILM Place 1 Film under the tongue 3 (three) times daily. 10/23/22   [provider]  cloNIDine (CATAPRES) 0.1 MG tablet Take 0.1 mg by mouth 3 (three) times daily as needed. Patient not taking: Reported on 12/07/2022 09/10/22   [provider]  gabapentin (NEURONTIN) 300 MG capsule Take 300 mg by mouth 3 (three) times daily. 10/22/22   [provider]  ondansetron (ZOFRAN-ODT) 8 MG disintegrating tablet Take 8 mg by mouth 3 (three) times daily. 10/22/22   [provider]    Physical Exam: Vitals:   12/08/22 1128 12/08/22 1226  BP: 125/80 124/81  Pulse: 95 100  Resp: 18 18  Temp: 98.3 F (36.8 C)   TempSrc: Oral   SpO2: 100% 100%  Weight: 50.3 kg   Height: 5\' 7"  (1.702 m)    Eyes: Pupils dilated and sluggish to light reflex, lids and  conjunctivae normal ENMT: Mucous membranes are moist. Posterior pharynx clear of any exudate or lesions.Normal dentition.  Neck: normal, supple, no masses, no thyromegaly Respiratory: clear to auscultation bilaterally, no wheezing, no crackles. Normal respiratory effort. No accessory muscle use.  Cardiovascular: Regular rate and  rhythm, no murmurs / rubs / gallops. No extremity edema. 2+ pedal pulses. No carotid bruits.  Abdomen: no tenderness, no masses palpated. No hepatosplenomegaly. Bowel sounds positive.  Musculoskeletal: no clubbing / cyanosis. No joint deformity upper and lower extremities. Good ROM, no contractures. Normal muscle tone.  Skin: no rashes, lesions, ulcers. No induration Neurologic: CN 2-12 grossly intact. Sensation intact, DTR normal.  Muscle strength 5/5 on both sides Psychiatric: Normal judgment and insight. Alert and oriented x 3. Normal mood.  Significantly anxious Data Reviewed:   There are no new results to review at this time.    Family Communication: None Primary team communication: Primary team Thank you very much for involving [place] in the care of your patient.  Author: Emeline General, MD 12/08/2022 2:01 PM  For on call review [email or web address].

## 2022-12-08 NOTE — Plan of Care (Signed)
  Problem: Health Behavior/Discharge Planning: Goal: Compliance with treatment plan for underlying cause of condition will improve Outcome: Progressing   Problem: Safety: Goal: Periods of time without injury will increase Outcome: Progressing   

## 2022-12-08 NOTE — Progress Notes (Signed)
Rn called to dayroom where it was reported patient possibly had a "mini seizure." Patient noticeably going through withdrawal, with runny nose, slight tremors, chills,  headache, and anxious. Patient A&Ox4. Vs stable. No active seizing observed at approach. Patient denies any current pain or additional symptoms. Patient was sitting down during this time. No injuries. NP and MD contacted. Patient received klonopin po. Patient currently laying down in room with no s/s of current distress.   BP 124/81 (BP Location: Left Arm)   Pulse 100   Temp 98.3 F (36.8 C) (Oral)   Resp 18   Ht 5\' 7"  (1.702 m)   Wt 50.3 kg   SpO2 100%   BMI 17.35 kg/m

## 2022-12-08 NOTE — ED Provider Notes (Signed)
Emergency Medicine Observation Re-evaluation Note  Dustin Morgan is a 22 y.o. male, seen on rounds today.  Pt initially presented to the ED for complaints of IVC, Suicide Attempt, and Drug Overdose Currently, the patient is resting.  Physical Exam  BP 118/75 (BP Location: Right Arm)   Pulse 78   Temp 98.6 F (37 C) (Oral)   Resp 15   Ht 5\' 7"  (1.702 m)   Wt 54.4 kg   SpO2 100%   BMI 18.79 kg/m  Physical Exam General: No distress Cardiac: Regular rate and rhythm Lungs: No increased work of breathing Psych: Calm  ED Course / MDM  EKG:   I have reviewed the labs performed to date as well as medications administered while in observation.  Recent changes in the last 24 hours include ongoing efforts for placement.  Plan  Current plan is for placement.    Gerhard Munch, MD 12/08/22 [phone]

## 2022-12-09 MED ORDER — FLUOXETINE HCL 20 MG PO CAPS
20.0000 mg | ORAL_CAPSULE | Freq: Every day | ORAL | Status: DC
Start: 2022-12-09 — End: 2022-12-13
  Administered 2022-12-09 – 2022-12-13 (×5): 20 mg via ORAL
  Filled 2022-12-09 (×5): qty 1

## 2022-12-09 MED ORDER — ENSURE ENLIVE PO LIQD
1.0000 | Freq: Three times a day (TID) | ORAL | Status: DC
Start: 2022-12-09 — End: 2022-12-13
  Administered 2022-12-09 – 2022-12-12 (×10): 237 mL via ORAL

## 2022-12-09 MED ORDER — DICYCLOMINE HCL 20 MG PO TABS
20.0000 mg | ORAL_TABLET | Freq: Three times a day (TID) | ORAL | Status: AC
  Administered 2022-12-09 – 2022-12-11 (×6): 20 mg via ORAL
  Filled 2022-12-09 (×6): qty 1

## 2022-12-09 MED ORDER — METHOCARBAMOL 500 MG PO TABS: 750.0000 mg | ORAL_TABLET | Freq: Four times a day (QID) | ORAL | Status: DC | PRN

## 2022-12-09 MED ORDER — BISMUTH SUBSALICYLATE 262 MG/15ML PO SUSP
30.0000 mL | ORAL | Status: DC | PRN
  Filled 2022-12-09: qty 118

## 2022-12-09 MED ORDER — TRAZODONE HCL 50 MG PO TABS
50.0000 mg | ORAL_TABLET | Freq: Every day | ORAL | Status: DC
Start: 2022-12-09 — End: 2022-12-13
  Administered 2022-12-09 – 2022-12-12 (×4): 50 mg via ORAL
  Filled 2022-12-09 (×4): qty 1

## 2022-12-09 NOTE — Progress Notes (Signed)
Pt calm and pleasant during assessment denying SI/HI/AVH. Pt stated he was feeling better tonight with his withdrawals systems. Pt compliant with medication administration per MD orders. Pt given education, support, and encouragement to be active in his treatment plan. Pt being monitored Q 15 minutes for safety per unit protocol, remains safe on the unit
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
## 2022-12-09 NOTE — Group Note (Signed)
Date:  12/09/2022 Time:  10:03 PM  Group Topic/Focus:  Making Healthy Choices:   The focus of this group is to help patients identify negative/unhealthy choices they were using prior to admission and identify positive/healthier coping strategies to replace them upon discharge.    Participation Level:  Active  Participation Quality:  Appropriate and Attentive  Affect:  Appropriate and Excited  Cognitive:  Alert and Appropriate  Insight: Appropriate, Good, and Improving  Engagement in Group:  Developing/Improving and Engaged  Modes of Intervention:  Activity, Clarification, Discussion, Education, Rapport Building, Role-play, Socialization, and Support  Additional Comments:     Serenitie Vinton 12/09/2022, 10:03 PM

## 2022-12-09 NOTE — Plan of Care (Signed)
Pt denies SI/HI/AVH, compliant with procedures on the unit   Problem: Education: Goal: Knowledge of [facility] Education information/materials will improve Outcome: Progressing Goal: Emotional status will improve Outcome: Progressing Goal: Mental status will improve Outcome: Progressing Goal: Verbalization of understanding the information provided will improve Outcome: Progressing   Problem: Activity: Goal: Interest or engagement in activities will improve Outcome: Progressing Goal: Sleeping patterns will improve Outcome: Progressing   Problem: Coping: Goal: Ability to verbalize frustrations and anger appropriately will improve Outcome: Progressing Goal: Ability to demonstrate self-control will improve Outcome: Progressing   Problem: Health Behavior/Discharge Planning: Goal: Identification of resources available to assist in meeting health care needs will improve Outcome: Progressing Goal: Compliance with treatment plan for underlying cause of condition will improve Outcome: Progressing   Problem: Physical Regulation: Goal: Ability to maintain clinical measurements within normal limits will improve Outcome: Progressing   Problem: Safety: Goal: Periods of time without injury will increase Outcome: Progressing

## 2022-12-09 NOTE — Group Note (Signed)
Date:  12/09/2022 Time:  7:14 PM  Group Topic/Focus:  Activity Group:  The focus of the group is to encourage activity with the patients and have them go outside to the courtyard for some fresh air and some exercise.    Participation Level:  Active  Participation Quality:  Appropriate  Affect:  Appropriate  Cognitive:  Appropriate  Insight: Appropriate  Engagement in Group:  Engaged  Modes of Intervention:  Activity  Additional Comments:    Dustin Morgan Rmoni Keplinger 12/09/2022, 7:14 PM

## 2022-12-09 NOTE — Progress Notes (Signed)
[facility] MD Progress Note  12/09/2022 7:02 PM Dustin Morgan  MRN:  [id number] Subjective:   22 year old Caucasian male reports "diarrhea all night" and concerns about ongoing withdrawal symptoms.describes symptoms of withdrawal, including sweats, chills, tremor, and excessive yawning. Requests medication for sleep at night and reports that Suboxone is the primary relief for withdrawal symptoms.Diarrhea likely related to withdrawal symptoms; managed with Pepto Bismol.Reported sleep disturbances; consideration for medication to support sleep as withdrawal symptoms persist.Calm, cooperative, and oriented. No signs of acute distress; main concern is managing withdrawal symptoms Principal Problem: Suicide attempt by benzodiazepine overdose (HCC) Diagnosis: Principal Problem:   Suicide attempt by benzodiazepine overdose (HCC) Active Problems:   Cocaine abuse (HCC)   Opiate abuse, continuous (HCC)   Benzodiazepine (tranquilizer) overdose  Total Time spent with patient: 1.5 hours  Past Psychiatric History: IVDA  Past Medical History:  Past Medical History:  Diagnosis Date   IV drug user     Past Surgical History:  Procedure Laterality Date   TONSILLECTOMY     Family History: History reviewed. No pertinent family history. Family Psychiatric  History: none noted Social History:  Social History   Substance and Sexual Activity  Alcohol Use Yes   Comment: occ     Social History   Substance and Sexual Activity  Drug Use Yes   Types: Marijuana, IV, Fentanyl, Oxycodone, Heroin   Comment: crack (smoke), fentanyl (IV), heroine (IV)    Social History   Socioeconomic History   Marital status: Single    Spouse name: Not on file   Number of children: Not on file   Years of education: Not on file   Highest education level: Not on file  Occupational History   Not on file  Tobacco Use   Smoking status: Every Day    Current packs/day: 1.50    Types: Cigarettes   Smokeless tobacco: Never   Vaping Use   Vaping status: Some Days  Substance and Sexual Activity   Alcohol use: Yes    Comment: occ   Drug use: Yes    Types: Marijuana, IV, Fentanyl, Oxycodone, Heroin    Comment: crack (smoke), fentanyl (IV), heroine (IV)   Sexual activity: Yes  Other Topics Concern   Not on file  Social History Narrative   Not on file   Social Determinants of Health   Financial Resource Strain: Not on file  Food Insecurity: No Food Insecurity (12/08/2022)   Hunger Vital Sign    Worried About Running Out of Food in the Last Year: Never true    Ran Out of Food in the Last Year: Never true  Transportation Needs: No Transportation Needs (12/08/2022)   PRAPARE - Administrator, Civil Service (Medical): No    Lack of Transportation (Non-Medical): No  Physical Activity: Not on file  Stress: Not on file  Social Connections: Not on file   Additional Social History:                         Sleep: Fair  Appetite:  Fair  Current Medications: Current Facility-Administered Medications  Medication Dose Route Frequency Provider Last Rate Last Admin   acetaminophen (TYLENOL) tablet 650 mg  650 mg Oral Q6H PRN Onuoha, Chinwendu V, NP       alum & mag hydroxide-simeth (MAALOX/MYLANTA) 200-200-20 MG/5ML suspension 30 mL  30 mL Oral Q4H PRN Onuoha, Chinwendu V, NP       bismuth subsalicylate (PEPTO BISMOL) 262 MG/15ML suspension  30 mL  30 mL Oral Q4H PRN Myriam Forehand, NP       buprenorphine-naloxone (SUBOXONE) 8-2 mg per SL tablet 1 tablet  1 tablet Sublingual TID Mikey College T, MD   1 tablet at 12/09/22 1657   clonazePAM (KLONOPIN) disintegrating tablet 1 mg  1 mg Oral BID Myriam Forehand, NP   1 mg at 12/09/22 1657   dicyclomine (BENTYL) tablet 20 mg  20 mg Oral TID AC & HS Myriam Forehand, NP       diphenhydrAMINE (BENADRYL) capsule 50 mg  50 mg Oral TID PRN Onuoha, Chinwendu V, NP   50 mg at 12/08/22 2059   Or   diphenhydrAMINE (BENADRYL) injection 50 mg  50 mg Intramuscular  TID PRN Onuoha, Chinwendu V, NP       feeding supplement (ENSURE ENLIVE / ENSURE PLUS) liquid 237 mL  1 Bottle Oral TID BM Sarina Ill, DO   237 mL at 12/09/22 1506   FLUoxetine (PROZAC) capsule 20 mg  20 mg Oral Daily Myriam Forehand, NP   20 mg at 12/09/22 1657   folic acid (FOLVITE) tablet 1 mg  1 mg Oral Daily Myriam Forehand, NP   1 mg at 12/09/22 0745   gabapentin (NEURONTIN) capsule 300 mg  300 mg Oral TID Mikey College T, MD   300 mg at 12/09/22 1657   lamoTRIgine (LAMICTAL) tablet 25 mg  25 mg Oral BID Myriam Forehand, NP   25 mg at 12/09/22 1657   LORazepam (ATIVAN) tablet 0-4 mg  0-4 mg Oral Q6H Myriam Forehand, NP   1 mg at 12/09/22 1504   Followed by   Melene Muller ON 12/10/2022] LORazepam (ATIVAN) tablet 0-4 mg  0-4 mg Oral Q6H PRN Myriam Forehand, NP       LORazepam (ATIVAN) tablet 1-4 mg  1-4 mg Oral Q1H PRN Myriam Forehand, NP       Or   LORazepam (ATIVAN) tablet 1 mg  1 mg Oral Q1H PRN Myriam Forehand, NP   1 mg at 12/09/22 9562   magnesium hydroxide (MILK OF MAGNESIA) suspension 30 mL  30 mL Oral Daily PRN Onuoha, Chinwendu V, NP       methocarbamol (ROBAXIN) tablet 750 mg  750 mg Oral Q6H PRN Myriam Forehand, NP       multivitamin with minerals tablet 1 tablet  1 tablet Oral Daily Myriam Forehand, NP   1 tablet at 12/09/22 0745   nicotine polacrilex (NICORETTE) gum 2 mg  2 mg Oral PRN Myriam Forehand, NP   2 mg at 12/09/22 1734   ondansetron (ZOFRAN) tablet 8 mg  8 mg Oral Q8H PRN Myriam Forehand, NP       thiamine (VITAMIN B1) tablet 100 mg  100 mg Oral Daily Myriam Forehand, NP   100 mg at 12/09/22 0745   traZODone (DESYREL) tablet 50 mg  50 mg Oral QHS Myriam Forehand, NP        Lab Results: No results found for this or any previous visit (from the past 48 hour(s)).  Blood Alcohol level:  Lab Results  Component Value Date   ETH <10 12/07/2022      Musculoskeletal: Strength & Muscle Tone: within normal limits Gait & Station: normal Patient leans: N/A  Psychiatric Specialty  Exam:  Presentation  General Appearance:  Appropriate for Environment  Eye Contact: Good  Speech: Clear and Coherent; Normal Rate  Speech  Volume: Normal  Handedness: Right   Mood and Affect  Mood: Anxious; Irritable  Affect: Flat   Thought Process  Thought Processes: Coherent  Descriptions of Associations:Intact  Orientation:Full (Time, Place and Person) (and situation)  Thought Content:WDL  History of Schizophrenia/Schizoaffective disorder:No data recorded Duration of Psychotic Symptoms:No data recorded Hallucinations:Hallucinations: None  Ideas of Reference:None  Suicidal Thoughts:Suicidal Thoughts: No SI Passive Intent and/or Plan: -- (none at this time)  Homicidal Thoughts:Homicidal Thoughts: No   Sensorium  Memory: Immediate Poor; Remote Fair  Judgment: Poor  Insight: Poor   Executive Functions  Concentration: Fair  Attention Span: Fair  Recall: Fair  Fund of Knowledge: Good  Language: Good   Psychomotor Activity  Psychomotor Activity: Psychomotor Activity: Normal   Assets  Assets: Financial Resources/Insurance; Housing; Communication Skills   Sleep  Sleep: Sleep: Fair Number of Hours of Sleep: 6    Physical Exam: Physical Exam Vitals and nursing note reviewed.  Constitutional:      Appearance: Normal appearance.  HENT:     Head: Normocephalic and atraumatic.     Nose: Rhinorrhea present.  Pulmonary:     Effort: Pulmonary effort is normal.  Musculoskeletal:        General: Normal range of motion.     Cervical back: Normal range of motion.  Neurological:     General: No focal deficit present.     Mental Status: He is alert and oriented to person, place, and time.    Review of Systems  HENT:  Positive for congestion.   Gastrointestinal:  Positive for abdominal pain and diarrhea.  Psychiatric/Behavioral:  The patient is nervous/anxious.   All other systems reviewed and are negative.  Blood pressure  110/68, pulse (!) 107, temperature 99.5 F (37.5 C), resp. rate 20, height 5\' 7"  (1.702 m), weight 50.3 kg, SpO2 99%. Body mass index is 17.35 kg/m.   Treatment Plan Summary: Daily contact with patient to assess and evaluate symptoms and progress in treatment and Medication management Klonopin 1 mg, BID. Trazodone 50 mg at bedtime for sleep. Pepto Bismol 30 mL every 4 hours as needed for diarrhea. Bentyl 20 mg TID before meals and at bedtime Trazodone 50 mg nightly to support sleep. Lamictal 50 mg twice daily for mood stabilization  Myriam Forehand, NP 12/09/2022, 7:02 PM

## 2022-12-09 NOTE — Group Note (Signed)
Date:  12/09/2022 Time:  6:35 PM  Group Topic/Focus:  Goals Group:   The focus of this group is to help patients establish daily goals to achieve during treatment and discuss how the patient can incorporate goal setting into their daily lives to aide in recovery.   Participation Level:  Active  Participation Quality:  Appropriate  Affect:  Appropriate  Cognitive:  Appropriate  Insight: Appropriate  Engagement in Group:  Engaged  Modes of Intervention:  Discussion and Education  Additional Comments:    Amaani Guilbault A Delio Slates 12/09/2022, 6:35 PM

## 2022-12-09 NOTE — Group Note (Signed)
Recreation Therapy Group Note   Group Topic:Other  Group Date: 12/09/2022 Start Time: 1000 End Time: 1100 Facilitators: Rosina Lowenstein, LRT, CTRS Location:  Craft Room  Group Description: Sport and exercise psychologist. Pts were given a card with a Halloween term on it and asked to draw it out on the dry erase board, one at a time and without using words or sound, for the group to guess what they have drawn. Once the image was successfully drawn, the next person would draw their term on the board. 2 mummies were identified voluntarily from the group. The rest of the group was split into two groups. Pts were encouraged to wrap the 2 mummies from head to toe with a roll of toilet paper. The team to do so fastest and with the whole roll of toilet paper, wins.   Goal Area(s) Addressed:  Patient will increase communication skills. Patient will engage in a team-building activity.  Patient will build frustration tolerance skills.  Patient will gain knowledge of an emotional expression activity, like drawing.    Affect/Mood: Appropriate   Participation Level: Active and Engaged   Participation Quality: Independent   Behavior: Calm and Cooperative   Speech/Thought Process: Coherent   Insight: Fair   Judgement: Fair    Modes of Intervention: Activity   Patient Response to Interventions:  Receptive   Education Outcome:  Acknowledges education   Clinical Observations/Individualized Feedback: Durel was mostly active in their participation of session activities and group discussion. Pt left group half way through and did not return. Pt interacted well with LRT and peers while present.    Plan: Continue to engage patient in RT group sessions 2-3x/week.   Rosina Lowenstein, LRT, CTRS 12/09/2022 11:25 AM

## 2022-12-09 NOTE — Progress Notes (Addendum)
Patient denies SI, HI, and AVH. Patient is calm and cooperative with assessment. His main concern this morning is withdrawal symptoms, which he describes as sweats,chills, tremor, and excessive yawning. He was given scheduled suboxone and Klonopin. CIWA 1 hour after administration was  3, so he was not given additional Ativan. Patient states that the suboxone is mainly what helps with withdrawals. He also states that he had diarrhea this morning. He also requests medication for sleep at night.  Patient later scored a 5 on CIWA and was given 1mg  Ativan at 1504.

## 2022-12-09 NOTE — Progress Notes (Signed)
Referring physician: Keith Rake Reason for consult: Seizure  HPI: Dustin Morgan is a 22 y.o. male with past medical history of polysubstance abuse including fentanyl, cocaine, amphetamine and benzos presented with suicidal attempt with drug overdose.  2 nights ago patient ingested 30 pills of 1 mg Xanax and unknown amount of IV fentanyl, and was found unresponsive by girlfriend who administered intranasal Narcan and called EMS.  This morning, while attending to her group therapy, patient started to have sweaty tremors runny nose headache and anxious cramping abdominal pain.  Reportedly patient was having a seizure-like movement at the moment for short period time and accompanied by nursing staff back to his bed.  Patient was given p.o. Klonopin.  At the time I saw the patient patient continued to experience chills, cramping abdominal pain and sweaty.  No nauseous vomiting no diarrhea.   10/31: Patient was seen and evaluated in the behavioral health unit.  His Suboxone was reinitiated 10/30 with good effect.  He denies any worsening withdrawal symptoms or seizure-like activity.  At this point unable to exclude seizure however suspect more opioid versus benzodiazepine withdrawal.  Patient is already on twice daily Klonopin.  Suboxone was restarted 10/30.  Recommendations: Continue Suboxone 8/2 3 times daily Continue Klonopin twice daily Agree with Lamictal No indication from medicine standpoint for further inpatient seizure workup at this point  [facility] hospitalist service to sign off at this time Thank you for allowing [place] to participate in the care of your patient  Lolita Patella MD  No charge

## 2022-12-09 NOTE — Progress Notes (Signed)
NUTRITION ASSESSMENT  Pt identified as at risk on the Malnutrition Screen Tool  INTERVENTION:  -Continue regular diet -Continue MVI with minerals daily -Continue 1 mg folic acid daily -Continue 161 mg thiamine daily -Ensure Enlive po TID, each supplement provides 350 kcal and 20 grams of protein  NUTRITION DIAGNOSIS: Unintentional weight loss related to sub-optimal intake as evidenced by pt report.   Goal: Pt to meet >/= 90% of their estimated nutrition needs.  Monitor:  PO intake  Assessment:  Pt admitted under IVC after suicide attempt involving drug overdose. Pt with a history of polysubstance abuse.   Per H&P, pt took 20 tablets of Xanax on 12/08/22.Pt has been on suboxone, but has had ongoing daily use of crack cocaine, heroin, zylazine, and fentanyl. Pt wishes to discontinue illegal drug use.   Pt on a regular diet. No meal completion data available to assess at this time. Suspect diet low in nutritional quality PTA secondary to weight loss and polysubstance abuse.   Reviewed wt hx; pt has experienced a 11.3% wt loss over the past 10 months. While this is not significant for time frame, it is concerning given pt's underweight status and polysubstance abuse. Pt is at high risk for malnutrition, however, unable to identify at this time. Pt would greatly benefit from addition of oral nutrition supplements.   Medications reviewed and include suboxone, klonopin, folic acid, ativan, MVI, and thiamine.   Labs reviewed, Tox screen positive for amphetamine, benzodiazepines, and cocaine.   22 y.o. male  Height: Ht Readings from Last 1 Encounters:  12/08/22 5\' 7"  (1.702 m)    Weight: Wt Readings from Last 1 Encounters:  12/08/22 50.3 kg    Weight Hx: Wt Readings from Last 10 Encounters:  12/08/22 50.3 kg  12/07/22 54.4 kg  01/21/22 56.7 kg  12/20/16 49.5 kg (2%, Z= -2.03)*  12/14/16 50.3 kg (3%, Z= -1.89)*   * Growth percentiles are based on CDC (Boys, 2-20 Years)  data.    BMI:  Body mass index is 17.35 kg/m. Pt meets criteria for underweight based on current BMI.  Estimated Nutritional Needs: Kcal: 25-30 kcal/kg Protein: > 1 gram protein/kg Fluid: 1 ml/kcal  Diet Order:  Diet Order             Diet regular Room service appropriate? Yes; Fluid consistency: Thin  Diet effective now                  Pt is also offered choice of unit snacks mid-morning and mid-afternoon.  Pt is eating as desired.   Lab results and medications reviewed.   Levada Schilling, RD, LDN, CDCES Registered Dietitian III Certified Diabetes Care and Education Specialist Please refer to [facility] for RD and/or RD on-call/weekend/after hours pager

## 2022-12-10 DIAGNOSIS — T424X2A Poisoning by benzodiazepines, intentional self-harm, initial encounter: Secondary | ICD-10-CM | POA: Diagnosis not present

## 2022-12-10 MED ORDER — NICOTINE 14 MG/24HR TD PT24: 14.0000 mg | MEDICATED_PATCH | Freq: Every day | TRANSDERMAL | Status: DC

## 2022-12-10 MED ORDER — CLONAZEPAM 0.25 MG PO TBDP
0.5000 mg | ORAL_TABLET | Freq: Two times a day (BID) | ORAL | Status: DC
  Administered 2022-12-10 – 2022-12-13 (×6): 0.5 mg via ORAL
  Filled 2022-12-10 (×6): qty 2

## 2022-12-10 MED ORDER — NICOTINE POLACRILEX 2 MG MT GUM
4.0000 mg | CHEWING_GUM | OROMUCOSAL | Status: DC | PRN
  Administered 2022-12-10 – 2022-12-13 (×9): 4 mg via ORAL
  Filled 2022-12-10 (×11): qty 2

## 2022-12-10 NOTE — Group Note (Signed)
Recreation Therapy Group Note   Group Topic:Leisure Education  Group Date: 12/10/2022 Start Time: 1100 End Time: 1200 Facilitators: Rosina Lowenstein, LRT, CTRS Location:  Craft Room  Group Description: Leisure. Patients were given the option to choose from singing karaoke, coloring mandalas, using oil pastels, journaling, or playing with play-doh. LRT and pts discussed the meaning of leisure, the importance of participating in leisure during their free time/when they're outside of the hospital, as well as how our leisure interests can also serve as coping skills.    Goal Area(s) Addressed:  Patient will identify a current leisure interest.  Patient will learn the definition of "leisure". Patient will practice making a positive decision. Patient will have the opportunity to try a new leisure activity. Patient will communicate with peers and LRT.    Affect/Mood: Appropriate   Participation Level: Active and Engaged   Participation Quality: Independent   Behavior: Calm and Cooperative   Speech/Thought Process: Coherent   Insight: Good   Judgement: Good   Modes of Intervention: Activity   Patient Response to Interventions:  Attentive, Engaged, Interested , and Receptive   Education Outcome:  Acknowledges education   Clinical Observations/Individualized Feedback: Dustin Morgan was active in their participation of session activities and group discussion. Pt identified "skateboard and swing on a swing set" as things he does in his free time. Pt chose to color mandalas and sign karaoke while in group. Pt interacted well with LRT and peers duration of session.    Plan: Continue to engage patient in RT group sessions 2-3x/week.   Rosina Lowenstein, LRT, CTRS 12/10/2022 12:59 PM

## 2022-12-10 NOTE — Progress Notes (Signed)
D: Pt alert and oriented. Pt denies depression and rates anxiety 7/10.  Pt denies experiencing any pain at this time. Pt denies experiencing any SI/HI, or AVH at this time.    A: Scheduled medications administered to pt, per MD orders, prn ativan 1 mg po given for c/o anxiety 7/10. Support and encouragement provided. Frequent verbal contact made. Routine safety checks conducted q15 minutes.   R: No adverse drug reactions noted. Pt verbally contracts for safety at this time. Pt complaint with medications. Pt interacts appropriately with others on the unit. Offered prn ativan noted as effective at reassessment. Pt remains safe at this time.

## 2022-12-10 NOTE — Progress Notes (Signed)
   12/10/22 1600  Psych Admission Type (Psych Patients Only)  Admission Status Involuntary  Psychosocial Assessment  Patient Complaints Anxiety;Substance abuse  Eye Contact Fair  Facial Expression Anxious;Worried  Affect Anxious;Appropriate to circumstance  Speech Logical/coherent  Interaction Assertive  Motor Activity Slow;Tremors  Appearance/Hygiene Unremarkable  Behavior Characteristics Cooperative;Appropriate to situation  Mood Anxious;Pleasant  Aggressive Behavior  Effect No apparent injury  Thought Process  Coherency Circumstantial  Content WDL  Delusions None reported or observed  Perception WDL  Hallucination None reported or observed  Judgment WDL  Confusion None  Danger to Self  Current suicidal ideation? Denies  Agreement Not to Harm Self Yes  Description of Agreement Verbal  Danger to Others  Danger to Others None reported or observed

## 2022-12-10 NOTE — Progress Notes (Signed)
[facility] MD Progress Note  12/10/2022 5:01 PM Dustin Morgan  MRN:  [id number] Subjective:  22 year old Caucasian male Request for nicotine patch, elevated heart rate, and temperature. Patient requests, "Can I get a nicotine patch?" Denies suicidal ideation (SI), homicidal ideation (HI), and self-injurious behaviors (SIB). Denies palpitations at this time..Elevated heart rate and temperature noted. CIWA protocol is ongoing.Patient is alert, oriented, and cooperative, with no signs of distress or agitation. Engages appropriately with staff and appears motivated to manage withdrawal symptoms. Principal Problem: Suicide attempt by benzodiazepine overdose (HCC) Diagnosis: Principal Problem:   Suicide attempt by benzodiazepine overdose (HCC) Active Problems:   Cocaine abuse (HCC)   Opiate abuse, continuous (HCC)   Benzodiazepine (tranquilizer) overdose  Total Time spent with patient: 1 hour  Past Psychiatric History: IVDA  Past Medical History:  Past Medical History:  Diagnosis Date   IV drug user     Past Surgical History:  Procedure Laterality Date   TONSILLECTOMY     Family History: History reviewed. No pertinent family history. Family Psychiatric  History: none reported Social History:  Social History   Substance and Sexual Activity  Alcohol Use Yes   Comment: occ     Social History   Substance and Sexual Activity  Drug Use Yes   Types: Marijuana, IV, Fentanyl, Oxycodone, Heroin   Comment: crack (smoke), fentanyl (IV), heroine (IV)    Social History   Socioeconomic History   Marital status: Single    Spouse name: Not on file   Number of children: Not on file   Years of education: Not on file   Highest education level: Not on file  Occupational History   Not on file  Tobacco Use   Smoking status: Every Day    Current packs/day: 1.50    Types: Cigarettes   Smokeless tobacco: Never  Vaping Use   Vaping status: Some Days  Substance and Sexual Activity   Alcohol use:  Yes    Comment: occ   Drug use: Yes    Types: Marijuana, IV, Fentanyl, Oxycodone, Heroin    Comment: crack (smoke), fentanyl (IV), heroine (IV)   Sexual activity: Yes  Other Topics Concern   Not on file  Social History Narrative   Not on file   Social Determinants of Health   Financial Resource Strain: Not on file  Food Insecurity: No Food Insecurity (12/08/2022)   Hunger Vital Sign    Worried About Running Out of Food in the Last Year: Never true    Ran Out of Food in the Last Year: Never true  Transportation Needs: No Transportation Needs (12/08/2022)   PRAPARE - Administrator, Civil Service (Medical): No    Lack of Transportation (Non-Medical): No  Physical Activity: Not on file  Stress: Not on file  Social Connections: Not on file   Additional Social History:                         Sleep: Good  Appetite:  Good  Current Medications: Current Facility-Administered Medications  Medication Dose Route Frequency Provider Last Rate Last Admin   acetaminophen (TYLENOL) tablet 650 mg  650 mg Oral Q6H PRN Onuoha, Chinwendu V, NP   650 mg at 12/10/22 0852   alum & mag hydroxide-simeth (MAALOX/MYLANTA) 200-200-20 MG/5ML suspension 30 mL  30 mL Oral Q4H PRN Onuoha, Chinwendu V, NP       bismuth subsalicylate (PEPTO BISMOL) 262 MG/15ML suspension 30 mL  30 mL  Oral Q4H PRN Myriam Forehand, NP       buprenorphine-naloxone (SUBOXONE) 8-2 mg per SL tablet 1 tablet  1 tablet Sublingual TID Mikey College T, MD   1 tablet at 12/10/22 1656   clonazePAM (KLONOPIN) disintegrating tablet 0.5 mg  0.5 mg Oral BID Myriam Forehand, NP       dicyclomine (BENTYL) tablet 20 mg  20 mg Oral TID AC & HS Myriam Forehand, NP   20 mg at 12/10/22 1239   diphenhydrAMINE (BENADRYL) capsule 50 mg  50 mg Oral TID PRN Onuoha, Chinwendu V, NP   50 mg at 12/09/22 2102   Or   diphenhydrAMINE (BENADRYL) injection 50 mg  50 mg Intramuscular TID PRN Onuoha, Chinwendu V, NP       feeding supplement  (ENSURE ENLIVE / ENSURE PLUS) liquid 237 mL  1 Bottle Oral TID BM Sarina Ill, DO   237 mL at 12/10/22 1500   FLUoxetine (PROZAC) capsule 20 mg  20 mg Oral Daily Myriam Forehand, NP   20 mg at 12/10/22 2130   folic acid (FOLVITE) tablet 1 mg  1 mg Oral Daily Myriam Forehand, NP   1 mg at 12/10/22 8657   gabapentin (NEURONTIN) capsule 300 mg  300 mg Oral TID Mikey College T, MD   300 mg at 12/10/22 1656   lamoTRIgine (LAMICTAL) tablet 25 mg  25 mg Oral BID Myriam Forehand, NP   25 mg at 12/10/22 1656   LORazepam (ATIVAN) tablet 1-4 mg  1-4 mg Oral Q1H PRN Myriam Forehand, NP       Or   LORazepam (ATIVAN) tablet 1 mg  1 mg Oral Q1H PRN Myriam Forehand, NP   1 mg at 12/10/22 1439   magnesium hydroxide (MILK OF MAGNESIA) suspension 30 mL  30 mL Oral Daily PRN Onuoha, Chinwendu V, NP       methocarbamol (ROBAXIN) tablet 750 mg  750 mg Oral Q6H PRN Myriam Forehand, NP       multivitamin with minerals tablet 1 tablet  1 tablet Oral Daily Myriam Forehand, NP   1 tablet at 12/10/22 8469   nicotine (NICODERM CQ - dosed in mg/24 hours) patch 14 mg  14 mg Transdermal Daily Myriam Forehand, NP       nicotine polacrilex (NICORETTE) gum 4 mg  4 mg Oral PRN Myriam Forehand, NP       ondansetron [facility]) tablet 8 mg  8 mg Oral Q8H PRN Myriam Forehand, NP       thiamine (VITAMIN B1) tablet 100 mg  100 mg Oral Daily Myriam Forehand, NP   100 mg at 12/10/22 0853   traZODone (DESYREL) tablet 50 mg  50 mg Oral QHS Myriam Forehand, NP   50 mg at 12/09/22 2102    Lab Results: No results found for this or any previous visit (from the past 48 hour(s)).  Blood Alcohol level:  Lab Results  Component Value Date   ETH <10 12/07/2022     Physical Findings: AIMS:  , ,  ,  ,    CIWA:  CIWA-Ar Total: 6 COWS:     Musculoskeletal: Strength & Muscle Tone: within normal limits Gait & Station: normal Patient leans: N/A  Psychiatric Specialty Exam:  Presentation  General Appearance:  Appropriate for Environment; Fairly  Groomed  Eye Contact: Good  Speech: Clear and Coherent; Normal Rate  Speech Volume: Normal  Handedness: Right  Mood and Affect  Mood: Anxious  Affect: Flat   Thought Process  Thought Processes: Coherent  Descriptions of Associations:Intact  Orientation:Full (Time, Place and Person) (and situation)  Thought Content:WDL  History of Schizophrenia/Schizoaffective disorder: none recorded Duration of Psychotic Symptoms: none recordered  Hallucinations:Hallucinations: None  Ideas of Reference:None  Suicidal Thoughts:Suicidal Thoughts: No SI Passive Intent and/or Plan: -- (none noted)  Homicidal Thoughts:Homicidal Thoughts: No   Sensorium  Memory: Immediate Fair; Remote Fair  Judgment: Fair  Insight: Fair   Art therapist  Concentration: Fair  Attention Span: Fair  Recall: Good  Fund of Knowledge: Good  Language: Good   Psychomotor Activity  Psychomotor Activity: Psychomotor Activity: Normal   Assets  Assets: Communication Skills; Financial Resources/Insurance; Housing; Talents/Skills   Sleep  Sleep: Sleep: Good Number of Hours of Sleep: 8    Physical Exam: Physical Exam Vitals and nursing note reviewed.  Constitutional:      Appearance: Normal appearance.  HENT:     Head: Normocephalic and atraumatic.     Nose: Nose normal.  Pulmonary:     Effort: Pulmonary effort is normal.  Musculoskeletal:        General: Normal range of motion.     Cervical back: Normal range of motion.  Neurological:     General: No focal deficit present.     Mental Status: He is alert and oriented to person, place, and time.  Psychiatric:        Attention and Perception: Attention and perception normal.        Mood and Affect: Mood is anxious. Affect is flat.        Speech: Speech normal.        Behavior: Behavior normal. Behavior is cooperative.        Thought Content: Thought content normal.        Cognition and Memory: Cognition  and memory normal.        Judgment: Judgment normal.    Review of Systems  All other systems reviewed and are negative.  Blood pressure 112/76, pulse (!) 128, temperature (!) 100.5 F (38.1 C), resp. rate 18, height 5\' 7"  (1.702 m), weight 50.3 kg, SpO2 97%. Body mass index is 17.35 kg/m.   Treatment Plan Summary: Daily contact with patient to assess and evaluate symptoms and progress in treatment and Medication management Recheck VS Klonopin 0.5 mg, BID taper Trazodone 50 mg at bedtime for sleep. Pepto Bismol 30 mL every 4 hours as needed for diarrhea. Bentyl 20 mg TID before meals and at bedtime Trazodone 50 mg nightly to support sleep. Lamictal 50 mg twice daily for mood stabilization  Myriam Forehand, NP 12/10/2022, 5:01 PM

## 2022-12-10 NOTE — Plan of Care (Signed)

## 2022-12-10 NOTE — Group Note (Signed)
Date:  12/10/2022 Time:  3:33 PM  Group Topic/Focus:  Activity Group:  The focus of the group is to promote activity for the patients and allow them to go outside in the courtyard for some fresh air and some exercise.    Participation Level:  Active  Participation Quality:  Appropriate  Affect:  Appropriate  Cognitive:  Appropriate  Insight: Appropriate  Engagement in Group:  Engaged  Modes of Intervention:  Activity  Additional Comments:    Dustin Morgan [name] 12/10/2022, 3:33 PM

## 2022-12-10 NOTE — BH IP Treatment Plan (Signed)
Interdisciplinary Treatment and Diagnostic Plan Update  12/10/2022 Time of Session: 10:08AM Dustin Morgan MRN: [id number]  Principal Diagnosis: Suicide attempt by benzodiazepine overdose [facility])  Secondary Diagnoses: Principal Problem:   Suicide attempt by benzodiazepine overdose (HCC) Active Problems:   Cocaine abuse (HCC)   Opiate abuse, continuous (HCC)   Benzodiazepine (tranquilizer) overdose   Current Medications:  Current Facility-Administered Medications  Medication Dose Route Frequency Provider Last Rate Last Admin   acetaminophen (TYLENOL) tablet 650 mg  650 mg Oral Q6H PRN Onuoha, Chinwendu V, NP   650 mg at 12/10/22 0852   alum & mag hydroxide-simeth (MAALOX/MYLANTA) 200-200-20 MG/5ML suspension 30 mL  30 mL Oral Q4H PRN Onuoha, Chinwendu V, NP       bismuth subsalicylate (PEPTO BISMOL) 262 MG/15ML suspension 30 mL  30 mL Oral Q4H PRN Myriam Forehand, NP       buprenorphine-naloxone (SUBOXONE) 8-2 mg per SL tablet 1 tablet  1 tablet Sublingual TID Mikey College T, MD   1 tablet at 12/10/22 1240   clonazePAM (KLONOPIN) disintegrating tablet 1 mg  1 mg Oral BID Myriam Forehand, NP   1 mg at 12/10/22 8295   dicyclomine (BENTYL) tablet 20 mg  20 mg Oral TID AC & HS Myriam Forehand, NP   20 mg at 12/10/22 1239   diphenhydrAMINE (BENADRYL) capsule 50 mg  50 mg Oral TID PRN Onuoha, Chinwendu V, NP   50 mg at 12/09/22 2102   Or   diphenhydrAMINE (BENADRYL) injection 50 mg  50 mg Intramuscular TID PRN Onuoha, Chinwendu V, NP       feeding supplement (ENSURE ENLIVE / ENSURE PLUS) liquid 237 mL  1 Bottle Oral TID BM Sarina Ill, DO   237 mL at 12/10/22 0900   FLUoxetine (PROZAC) capsule 20 mg  20 mg Oral Daily Myriam Forehand, NP   20 mg at 12/10/22 0853   folic acid (FOLVITE) tablet 1 mg  1 mg Oral Daily Myriam Forehand, NP   1 mg at 12/10/22 6213   gabapentin (NEURONTIN) capsule 300 mg  300 mg Oral TID Mikey College T, MD   300 mg at 12/10/22 1240   lamoTRIgine (LAMICTAL) tablet 25 mg   25 mg Oral BID Myriam Forehand, NP   25 mg at 12/10/22 0853   LORazepam (ATIVAN) tablet 0-4 mg  0-4 mg Oral Q6H Myriam Forehand, NP   2 mg at 12/09/22 2024   Followed by   LORazepam (ATIVAN) tablet 0-4 mg  0-4 mg Oral Q6H PRN Myriam Forehand, NP       LORazepam (ATIVAN) tablet 1-4 mg  1-4 mg Oral Q1H PRN Myriam Forehand, NP       Or   LORazepam (ATIVAN) tablet 1 mg  1 mg Oral Q1H PRN Myriam Forehand, NP   1 mg at 12/10/22 0865   magnesium hydroxide (MILK OF MAGNESIA) suspension 30 mL  30 mL Oral Daily PRN Onuoha, Chinwendu V, NP       methocarbamol (ROBAXIN) tablet 750 mg  750 mg Oral Q6H PRN Myriam Forehand, NP       multivitamin with minerals tablet 1 tablet  1 tablet Oral Daily Myriam Forehand, NP   1 tablet at 12/10/22 0853   nicotine polacrilex (NICORETTE) gum 2 mg  2 mg Oral PRN Myriam Forehand, NP   2 mg at 12/10/22 1250   ondansetron (ZOFRAN) tablet 8 mg  8 mg Oral Q8H PRN  Myriam Forehand, NP       thiamine (VITAMIN B1) tablet 100 mg  100 mg Oral Daily Myriam Forehand, NP   100 mg at 12/10/22 6433   traZODone (DESYREL) tablet 50 mg  50 mg Oral QHS Myriam Forehand, NP   50 mg at 12/09/22 2102   PTA Medications: Medications Prior to Admission  Medication Sig Dispense Refill Last Dose   Buprenorphine HCl-Naloxone HCl 8-2 MG FILM Place 1 Film under the tongue 3 (three) times daily.      cloNIDine (CATAPRES) 0.1 MG tablet Take 0.1 mg by mouth 3 (three) times daily as needed. (Patient not taking: Reported on 12/07/2022)      gabapentin (NEURONTIN) 300 MG capsule Take 300 mg by mouth 3 (three) times daily.      ondansetron (ZOFRAN-ODT) 8 MG disintegrating tablet Take 8 mg by mouth 3 (three) times daily.       Patient Stressors: Substance abuse    Patient Strengths: Supportive family/friends   Treatment Modalities: Medication Management, Group therapy, Case management,  1 to 1 session with clinician, Psychoeducation, Recreational therapy.   Physician Treatment Plan for Primary Diagnosis: Suicide attempt  by benzodiazepine overdose (HCC) Long Term Goal(s): Improvement in symptoms so as ready for discharge   Short Term Goals: Ability to identify changes in lifestyle to reduce recurrence of condition will improve Ability to verbalize feelings will improve Ability to disclose and discuss suicidal ideas Ability to demonstrate self-control will improve Ability to identify and develop effective coping behaviors will improve Ability to maintain clinical measurements within normal limits will improve Compliance with prescribed medications will improve Ability to identify triggers associated with substance abuse/mental health issues will improve  Medication Management: Evaluate patient's response, side effects, and tolerance of medication regimen.  Therapeutic Interventions: 1 to 1 sessions, Unit Group sessions and Medication administration.  Evaluation of Outcomes: Not Met  Physician Treatment Plan for Secondary Diagnosis: Principal Problem:   Suicide attempt by benzodiazepine overdose (HCC) Active Problems:   Cocaine abuse (HCC)   Opiate abuse, continuous (HCC)   Benzodiazepine (tranquilizer) overdose  Long Term Goal(s): Improvement in symptoms so as ready for discharge   Short Term Goals: Ability to identify changes in lifestyle to reduce recurrence of condition will improve Ability to verbalize feelings will improve Ability to disclose and discuss suicidal ideas Ability to demonstrate self-control will improve Ability to identify and develop effective coping behaviors will improve Ability to maintain clinical measurements within normal limits will improve Compliance with prescribed medications will improve Ability to identify triggers associated with substance abuse/mental health issues will improve     Medication Management: Evaluate patient's response, side effects, and tolerance of medication regimen.  Therapeutic Interventions: 1 to 1 sessions, Unit Group sessions and Medication  administration.  Evaluation of Outcomes: Not Met   RN Treatment Plan for Primary Diagnosis: Suicide attempt by benzodiazepine overdose (HCC) Long Term Goal(s): Knowledge of disease and therapeutic regimen to maintain health will improve  Short Term Goals: Ability to demonstrate self-control, Ability to participate in decision making will improve, Ability to verbalize feelings will improve, Ability to disclose and discuss suicidal ideas, Ability to identify and develop effective coping behaviors will improve, and Compliance with prescribed medications will improve  Medication Management: RN will administer medications as ordered by provider, will assess and evaluate patient's response and provide education to patient for prescribed medication. RN will report any adverse and/or side effects to prescribing provider.  Therapeutic Interventions: 1 on 1 counseling sessions,  Psychoeducation, Medication administration, Evaluate responses to treatment, Monitor vital signs and CBGs as ordered, Perform/monitor CIWA, COWS, AIMS and Fall Risk screenings as ordered, Perform wound care treatments as ordered.  Evaluation of Outcomes: Not Met   LCSW Treatment Plan for Primary Diagnosis: Suicide attempt by benzodiazepine overdose (HCC) Long Term Goal(s): Safe transition to appropriate next level of care at discharge, Engage patient in therapeutic group addressing interpersonal concerns.  Short Term Goals: Engage patient in aftercare planning with referrals and resources, Increase social support, Increase ability to appropriately verbalize feelings, Increase emotional regulation, Facilitate acceptance of mental health diagnosis and concerns, Facilitate patient progression through stages of change regarding substance use diagnoses and concerns, Identify triggers associated with mental health/substance abuse issues, and Increase skills for wellness and recovery  Therapeutic Interventions: Assess for all discharge  needs, 1 to 1 time with Social worker, Explore available resources and support systems, Assess for adequacy in community support network, Educate family and significant other(s) on suicide prevention, Complete Psychosocial Assessment, Interpersonal group therapy.  Evaluation of Outcomes: Not Met   Progress in Treatment: Attending groups: Yes. Participating in groups: Yes. Taking medication as prescribed: Yes. Toleration medication: Yes. Family/Significant other contact made: No, will contact:  once permission has been given. Patient understands diagnosis: Yes. Discussing patient identified problems/goals with staff: Yes. Medical problems stabilized or resolved: Yes. Denies suicidal/homicidal ideation: Yes. Issues/concerns per patient self-inventory: No. Other: none  New problem(s) identified: No, Describe:  none  New Short Term/Long Term Goal(s): detox, elimination of symptoms of psychosis, medication management for mood stabilization; elimination of SI thoughts; development of comprehensive mental wellness/sobriety plan.   Patient Goals:  "keep my mind off the outside and keep myself focused"  Discharge Plan or Barriers: Patient reports plans to return to the home with his parents. He reports that he is open to treatment however, would like for it to be outside of his work hours.  CSW to continue to assist in the development of appropriate discharge plans.   Reason for Continuation of Hospitalization: Anxiety Depression Medication stabilization Suicidal ideation Withdrawal symptoms  Estimated Length of Stay:  1-7 days  Last 3 [place] Suicide Severity Risk Score: Flowsheet Row Admission (Current) from 12/08/2022 in [facility] ED from 12/07/2022 in [facility] Emergency Department at [facility] from 01/21/2022 in [facility] Department at [facility]  C-SSRS RISK CATEGORY High Risk High Risk No Risk       Last PHQ 2/9  Scores:     No data to display          Scribe for Treatment Team: Harden Mo, LCSW 12/10/2022 1:01 PM

## 2022-12-10 NOTE — Plan of Care (Signed)
 Pt denies SI/HI/AVH, compliant with procedures on the unit   Problem: Education: Goal: Knowledge of [place] [facility] Education information/materials will improve Outcome: Progressing Goal: Emotional status will improve Outcome: Progressing Goal: Mental status will improve Outcome: Progressing Goal: Verbalization of understanding the information provided will improve Outcome: Progressing   Problem: Activity: Goal: Interest or engagement in activities will improve Outcome: Progressing Goal: Sleeping patterns will improve Outcome: Progressing   Problem: Coping: Goal: Ability to verbalize frustrations and anger appropriately will improve Outcome: Progressing Goal: Ability to demonstrate self-control will improve Outcome: Progressing   Problem: Health Behavior/Discharge Planning: Goal: Identification of resources available to assist in meeting health care needs will improve Outcome: Progressing Goal: Compliance with treatment plan for underlying cause of condition will improve Outcome: Progressing   Problem: Physical Regulation: Goal: Ability to maintain clinical measurements within normal limits will improve Outcome: Progressing   Problem: Safety: Goal: Periods of time without injury will increase Outcome: Progressing

## 2022-12-11 NOTE — Progress Notes (Signed)
D- Patient alert and oriented. Patient presented in an anxious, but pleasant mood on assessment reporting that he slept good last night, "the best since I've been here", and had no complaints to voice to this Clinical research associate. Patient denied depression, but endorsed anxiety, rating it a "5/10", stating that "worrying about the outside and my cash[employer] card", is the reason he's feeling this way. Patient also denied SI, HI, AVH, and pain at this time. Patient's goal for today is "to focus on my plan when I go home and stay safe and sober", in which he will "stay focused and not be triggered by any intrusive thoughts", in order to achieve his goal.  A- Scheduled medications administered to patient, per MD orders. Support and encouragement provided.  Routine safety checks conducted every 15 minutes.  Patient informed to notify staff with problems or concerns.  R- No adverse drug reactions noted. Patient contracts for safety at this time. Patient compliant with medications and treatment plan. Patient receptive, calm, and cooperative. Patient interacts well with others on the unit.  Patient remains safe at this time.   12/11/22 1600  Psych Admission Type (Psych Patients Only)  Admission Status Involuntary  Psychosocial Assessment  Patient Complaints Anxiety  Eye Contact Fair  Facial Expression Anxious;Worried  Affect Appropriate to circumstance  Surveyor, minerals Activity Slow  Appearance/Hygiene Unremarkable  Behavior Characteristics Cooperative;Appropriate to situation  Mood Anxious;Pleasant  Aggressive Behavior  Effect No apparent injury  Thought Process  Coherency Circumstantial  Content WDL  Delusions None reported or observed  Perception WDL  Hallucination None reported or observed  Judgment WDL  Confusion None  Danger to Self  Current suicidal ideation? Denies  Agreement Not to Harm Self Yes  Description of Agreement Verbal  Danger to Others  Danger to  Others None reported or observed

## 2022-12-11 NOTE — Group Note (Signed)
Date:  12/11/2022 Time:  12:40 PM  Group Topic/Focus:  Goals Group:   The focus of this group is to help patients establish daily goals to achieve during treatment and discuss how the patient can incorporate goal setting into their daily lives to aide in recovery.    Participation Level:  Active  Participation Quality:  Appropriate  Affect:  Appropriate  Cognitive:  Appropriate  Insight: Appropriate  Engagement in Group:  Engaged  Modes of Intervention:  Activity  Additional Comments:    Chioma Mukherjee 12/11/2022, 12:40 PM
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
## 2022-12-11 NOTE — Plan of Care (Signed)

## 2022-12-11 NOTE — Progress Notes (Signed)
Another patient on the unit just came to this writer and stated that they have reason to believe that this patient is cheeking his Suboxone and giving it to other patients on the unit. NP will be notified via secure chat. This will also be passed along to the oncoming staff.

## 2022-12-11 NOTE — Group Note (Signed)
Date:  12/11/2022 Time:  9:24 PM  Group Topic/Focus:  Wrap-Up Group:   The focus of this group is to help patients review their daily goal of treatment and discuss progress on daily workbooks.    Participation Level:  Active  Participation Quality:  Appropriate, Attentive, and Monopolizing  Affect:  Appropriate and Blunted  Cognitive:  Alert and Appropriate  Insight: Appropriate  Engagement in Group:  Distracting, Engaged, and Monopolizing  Modes of Intervention:  Discussion  Additional Comments:  Pt is very loud in conversation. Pt is showing leadership within the group setting. Pt is very attentive to helping staff with what he says. People pleasing seems to be his ammo   [name] 12/11/2022, 9:24 PM

## 2022-12-12 DIAGNOSIS — T424X2A Poisoning by benzodiazepines, intentional self-harm, initial encounter: Secondary | ICD-10-CM | POA: Diagnosis not present

## 2022-12-12 NOTE — BHH Counselor (Signed)
CSW contacted Marteze Vecchio ([phone]), pt's father to complete SPE.   Onalee Hua reports that he feels pt is ready to come home. He also reports no concerns of access to weapons.  Onalee Hua did ask questions regarding follow-up appts. Onalee Hua states he wants someone to "call him on daily basis, the more contact and the more encouragement with other the people than just [place] the better Ranjit will be"  Onalee Hua also reports pt needs a work note.   CSW will inform care team.    Reynaldo Minium, MSW, [facility] 12/12/2022 4:12 PM

## 2022-12-12 NOTE — BHH Counselor (Signed)
Patient inquired from the [facility] when he would be discharged. Patient asked for resources for a therapist or psychiatrist.    The LCSWA informed the patient that there was no anticipated discharge date set for the patient as of yet, but she would inquire about any plans of discharge from the provider.    Patric Dykes, MSW. Theresia Majors 12/11/22

## 2022-12-12 NOTE — Progress Notes (Signed)
[facility] MD Progress Note  12/12/2022 10:48 AM [name]  MRN:  [id number]  Subjective:  [name] reported " feeling a lot better feeling ready to leave."  Evaluation: Dustin Morgan is a 22 year old Caucasian male was seen and evaluated face-to-face by this provider.  Continues to deny suicidal and homicidal ideations.  Denies auditory visual hallucinations.  He reports he has been taking his medications as indicated.  Denying medication side effects.  Nursing staff reports concerns related to patient cheeking his medications and to distribute him to appears on the unit.  Patient denied allegations stating that he now has to stay in front of the nurse until medication dissolves.  [name] is denying depressive symptoms.  Rates his anxiety at 3 out of 10 with 10 being the worst.  Reports plans to follow-up with substance abuse treatment groups at discharge.  Reports he is ready to get back to work.  Denied withdrawal symptoms and/or cravings.  Reports a good appetite.  States he is resting well throughout the night.  Staff to continue to monitor for safety.  Support encouragement reassurance was provided.  Discussed anticipated discharge 12/13/2022.  Patient was receptive to plan.  [name] observed interacting with peers, he is alert/oriented x 3; calm/cooperative; and mood congruent with affect.  Patient is speaking in a clear tone at moderate volume, and normal pace; with good eye contact.  thought process is coherent and relevant; There is no indication that he is currently responding to internal/external stimuli or experiencing delusional thought content.  Patient denies suicidal/self-harm/homicidal ideation, psychosis, and paranoia.  Patient has remained calm throughout assessment and has answered questions appropriately.   Principal Problem: Suicide attempt by benzodiazepine overdose (HCC) Diagnosis: Principal Problem:   Suicide attempt by benzodiazepine overdose (HCC) Active Problems:    Cocaine abuse (HCC)   Opiate abuse, continuous (HCC)   Benzodiazepine (tranquilizer) overdose  Total Time spent with patient: 15 minutes  Past Psychiatric History:   Past Medical History:  Past Medical History:  Diagnosis Date   IV drug user     Past Surgical History:  Procedure Laterality Date   TONSILLECTOMY     Family History: History reviewed. No pertinent family history. Family Psychiatric  History:  Social History:  Social History   Substance and Sexual Activity  Alcohol Use Yes   Comment: occ     Social History   Substance and Sexual Activity  Drug Use Yes   Types: Marijuana, IV, Fentanyl, Oxycodone, Heroin   Comment: crack (smoke), fentanyl (IV), heroine (IV)    Social History   Socioeconomic History   Marital status: Single    Spouse name: Not on file   Number of children: Not on file   Years of education: Not on file   Highest education level: Not on file  Occupational History   Not on file  Tobacco Use   Smoking status: Every Day    Current packs/day: 1.50    Types: Cigarettes   Smokeless tobacco: Never  Vaping Use   Vaping status: Some Days  Substance and Sexual Activity   Alcohol use: Yes    Comment: occ   Drug use: Yes    Types: Marijuana, IV, Fentanyl, Oxycodone, Heroin    Comment: crack (smoke), fentanyl (IV), heroine (IV)   Sexual activity: Yes  Other Topics Concern   Not on file  Social History Narrative   Not on file   Social Determinants of Health   Financial Resource Strain: Not on file  Food  Insecurity: No Food Insecurity (12/08/2022)   Hunger Vital Sign    Worried About Running Out of Food in the Last Year: Never true    Ran Out of Food in the Last Year: Never true  Transportation Needs: No Transportation Needs (12/08/2022)   PRAPARE - Administrator, Civil Service (Medical): No    Lack of Transportation (Non-Medical): No  Physical Activity: Not on file  Stress: Not on file  Social Connections: Not on file    Additional Social History:                         Sleep: Good  Appetite:  Good  Current Medications: Current Facility-Administered Medications  Medication Dose Route Frequency Provider Last Rate Last Admin   acetaminophen (TYLENOL) tablet 650 mg  650 mg Oral Q6H PRN Onuoha, Chinwendu V, NP   650 mg at 12/10/22 0852   alum & mag hydroxide-simeth (MAALOX/MYLANTA) 200-200-20 MG/5ML suspension 30 mL  30 mL Oral Q4H PRN Onuoha, Chinwendu V, NP       bismuth subsalicylate (PEPTO BISMOL) 262 MG/15ML suspension 30 mL  30 mL Oral Q4H PRN Myriam Forehand, NP       buprenorphine-naloxone (SUBOXONE) 8-2 mg per SL tablet 1 tablet  1 tablet Sublingual TID Mikey College T, MD   1 tablet at 12/12/22 0745   clonazePAM (KLONOPIN) disintegrating tablet 0.5 mg  0.5 mg Oral BID Myriam Forehand, NP   0.5 mg at 12/12/22 0745   diphenhydrAMINE (BENADRYL) capsule 50 mg  50 mg Oral TID PRN Onuoha, Chinwendu V, NP   50 mg at 12/09/22 2102   Or   diphenhydrAMINE (BENADRYL) injection 50 mg  50 mg Intramuscular TID PRN Onuoha, Chinwendu V, NP       feeding supplement (ENSURE ENLIVE / ENSURE PLUS) liquid 237 mL  1 Bottle Oral TID BM Sarina Ill, DO   237 mL at 12/12/22 1007   FLUoxetine (PROZAC) capsule 20 mg  20 mg Oral Daily Myriam Forehand, NP   20 mg at 12/12/22 0745   folic acid (FOLVITE) tablet 1 mg  1 mg Oral Daily Myriam Forehand, NP   1 mg at 12/12/22 0745   gabapentin (NEURONTIN) capsule 300 mg  300 mg Oral TID Mikey College T, MD   300 mg at 12/12/22 0745   lamoTRIgine (LAMICTAL) tablet 25 mg  25 mg Oral BID Myriam Forehand, NP   25 mg at 12/12/22 0745   magnesium hydroxide (MILK OF MAGNESIA) suspension 30 mL  30 mL Oral Daily PRN Onuoha, Chinwendu V, NP       methocarbamol (ROBAXIN) tablet 750 mg  750 mg Oral Q6H PRN Myriam Forehand, NP       multivitamin with minerals tablet 1 tablet  1 tablet Oral Daily Myriam Forehand, NP   1 tablet at 12/12/22 0745   nicotine (NICODERM CQ - dosed in mg/24 hours)  patch 14 mg  14 mg Transdermal Daily Myriam Forehand, NP       nicotine polacrilex (NICORETTE) gum 4 mg  4 mg Oral PRN Myriam Forehand, NP   4 mg at 12/12/22 0748   ondansetron (ZOFRAN) tablet 8 mg  8 mg Oral Q8H PRN Myriam Forehand, NP       thiamine (VITAMIN B1) tablet 100 mg  100 mg Oral Daily Myriam Forehand, NP   100 mg at 12/12/22 0747   traZODone (DESYREL)  tablet 50 mg  50 mg Oral QHS Myriam Forehand, NP   50 mg at 12/11/22 2104    Lab Results: No results found for this or any previous visit (from the past 48 hour(s)).  Blood Alcohol level:  Lab Results  Component Value Date   ETH <10 12/07/2022    Metabolic Disorder Labs: No results found for: "HGBA1C", "MPG" No results found for: "PROLACTIN" No results found for: "CHOL", "TRIG", "HDL", "CHOLHDL", "VLDL", "LDLCALC"  Physical Findings: AIMS:  , ,  ,  ,    CIWA:  CIWA-Ar Total: 6 COWS:     Musculoskeletal: Strength & Muscle Tone: within normal limits Gait & Station: normal Patient leans: N/A  Psychiatric Specialty Exam:  Presentation  General Appearance:  Appropriate for Environment  Eye Contact: Good  Speech: Clear and Coherent  Speech Volume: Normal  Handedness: Right   Mood and Affect  Mood: Anxious  Affect: Congruent   Thought Process  Thought Processes: Coherent  Descriptions of Associations:Intact  Orientation:Full (Time, Place and Person)  Thought Content:Logical  History of Schizophrenia/Schizoaffective disorder:No data recorded Duration of Psychotic Symptoms:No data recorded Hallucinations:Hallucinations: None  Ideas of Reference:None  Suicidal Thoughts:Suicidal Thoughts: No  Homicidal Thoughts:Homicidal Thoughts: No   Sensorium  Memory: Recent Good; Immediate Good  Judgment: Good  Insight: Good   Executive Functions  Concentration: Fair  Attention Span: Fair  Recall: Good  Fund of Knowledge: Good  Language: Fair   Psychomotor Activity  Psychomotor  Activity: Psychomotor Activity: Normal   Assets  Assets: Desire for Improvement   Sleep  Sleep: Sleep: Fair Number of Hours of Sleep: 8    Physical Exam: Physical Exam Vitals and nursing note reviewed.  Cardiovascular:     Rate and Rhythm: Normal rate.  Skin:    General: Skin is warm.  Psychiatric:        Mood and Affect: Mood normal.        Behavior: Behavior normal.        Thought Content: Thought content normal.    Review of Systems  Constitutional: Negative.   Psychiatric/Behavioral:  Positive for depression and substance abuse. Negative for suicidal ideas.    Blood pressure 109/69, pulse 80, temperature 99.1 F (37.3 C), resp. rate 19, height 5\' 7"  (1.702 m), weight 50.3 kg, SpO2 100%. Body mass index is 17.35 kg/m.   Treatment Plan Summary: Daily contact with patient to assess and evaluate symptoms and progress in treatment and Medication management  Continue with current treatment plan on 12/12/2022 as listed below except with  Substance-induced mood disorder: Major depressive disorder: Generalized anxiety disorder:   Continue Ativan taper Continue Prozac 20 mg daily Continue lamotrigine 25 mg twice daily Continue Suboxone 8-2 mg sublingual Continue trazodone 50 mg nightly Continue gabapentin 300 mg twice daily   Anticipated discharge 12/13/2022  CSW to continue working on discharge disposition Patient encouraged to participate within the therapeutic milieu   Oneta Rack, NP 12/12/2022, 10:48 AM

## 2022-12-12 NOTE — Plan of Care (Signed)
  Problem: Coping: Goal: Ability to verbalize frustrations and anger appropriately will improve Outcome: Progressing   Problem: Coping: Goal: Ability to demonstrate self-control will improve Outcome: Progressing

## 2022-12-12 NOTE — Progress Notes (Signed)
Nursing Shift Note:  1900-0700  Attended Evening Group: yes Medication Compliant:  yes Behavior: intrusive, hyperaactive Sleep Quality: good Significant Changes: none noted   12/12/22 0100  Psych Admission Type (Psych Patients Only)  Admission Status Involuntary  Psychosocial Assessment  Patient Complaints Anxiety;Hyperactivity  Eye Contact Fair  Facial Expression Anxious  Affect Appropriate to circumstance  Speech Logical/coherent  Interaction Assertive  Motor Activity Slow  Appearance/Hygiene Unremarkable  Behavior Characteristics Cooperative  Mood Anxious  Thought Process  Coherency WDL  Content WDL  Delusions None reported or observed  Perception WDL  Hallucination None reported or observed  Judgment WDL  Confusion None  Danger to Self  Current suicidal ideation? Denies  Danger to Others  Danger to Others None reported or observed

## 2022-12-12 NOTE — Group Note (Signed)
Date:  12/12/2022 Time:  10:12 AM  Group Topic/Focus:  Conflict Resolution:   The focus of this group is to discuss the conflict resolution process and how it may be used upon discharge. Goals Group:   The focus of this group is to help patients establish daily goals to achieve during treatment and discuss how the patient can incorporate goal setting into their daily lives to aide in recovery. Healthy Communication:   The focus of this group is to discuss communication, barriers to communication, as well as healthy ways to communicate with others.    Participation Level:  Active  Participation Quality:  Appropriate  Affect:  Appropriate  Cognitive:  Appropriate  Insight: Appropriate  Engagement in Group:  Developing/Improving  Modes of Intervention:  Activity, Discussion, and Education  Additional Comments:    Buddie Marston 12/12/2022, 10:12 AM

## 2022-12-12 NOTE — Plan of Care (Signed)
°  Problem: Education: °Goal: Knowledge of [facility] Education information/materials will improve °Outcome: Not Progressing °Goal: Emotional status will improve °Outcome: Not Progressing °Goal: Mental status will improve °Outcome: Not Progressing °Goal: Verbalization of understanding the information provided will improve °Outcome: Not Progressing °  °

## 2022-12-12 NOTE — [facility] Suicide Risk Assessment (Signed)
[facility] INPATIENT:  Family/Significant Other Suicide Prevention Education  Suicide Prevention Education:  Education Completed; Dustin Morgan, father, [phone] has been identified by the patient as the family member/significant other with whom the patient will be residing, and identified as the person(s) who will aid the patient in the event of a mental health crisis (suicidal ideations/suicide attempt).  With written consent from the patient, the family member/significant other has been provided the following suicide prevention education, prior to the and/or following the discharge of the patient.  The suicide prevention education provided includes the following: Suicide risk factors Suicide prevention and interventions National Suicide Hotline telephone number [facility] assessment telephone number [facility] Emergency Assistance [facility] and/or Residential Mobile Crisis Unit telephone number  Request made of family/significant other to: Remove weapons (e.g., guns, rifles, knives), all items previously/currently identified as safety concern.   Remove drugs/medications (over-the-counter, prescriptions, illicit drugs), all items previously/currently identified as a safety concern.  The family member/significant other verbalizes understanding of the suicide prevention education information provided.  The family member/significant other agrees to remove the items of safety concern listed above.  Dustin Morgan 12/12/2022, 4:12 PM

## 2022-12-12 NOTE — Group Note (Signed)
Date:  12/12/2022 Time:  4:17 PM  Group Topic/Focus:  OUTDOOR RECREATION STRUCTURED ACTIVITY    Participation Level:  Active  Participation Quality:  Appropriate  Affect:  Appropriate  Cognitive:  Appropriate  Insight: Appropriate  Engagement in Group:  Developing/Improving  Modes of Intervention:  Activity  Additional Comments:    Daritza Brees 12/12/2022, 4:17 PM

## 2022-12-12 NOTE — Group Note (Signed)
[facility] LCSW Group Therapy Note   Group Date: 12/12/2022 Start Time: 1550 End Time: 1635   Type of Therapy/Topic:  Group Therapy:  Emotion Regulation  Participation Level:  Active   Mood:  Description of Group:    The purpose of this group is to assist patients in learning to regulate negative emotions and experience positive emotions. Patients will be guided to discuss ways in which they have been vulnerable to their negative emotions. These vulnerabilities will be juxtaposed with experiences of positive emotions or situations, and patients challenged to use positive emotions to combat negative ones. Special emphasis will be placed on coping with negative emotions in conflict situations, and patients will process healthy conflict resolution skills.  Therapeutic Goals: Patient will identify two positive emotions or experiences to reflect on in order to balance out negative emotions:  Patient will label two or more emotions that they find the most difficult to experience:  Patient will be able to demonstrate positive conflict resolution skills through discussion or role plays:   Summary of Patient Progress:  The patient attended group. The patient participated during the game of Emotional Regulation BINGO. The patient participated during the group discussion.     Therapeutic Modalities:   Cognitive Behavioral Therapy Feelings Identification Dialectical Behavioral Therapy   Marshell Levan, LCSW

## 2022-12-12 NOTE — Group Note (Signed)
Date:  12/12/2022 Time:  9:11 PM  Group Topic/Focus:  Wrap-Up Group:   The focus of this group is to help patients review their daily goal of treatment and discuss progress on daily workbooks.    Participation Level:  Active  Participation Quality:  Appropriate and Attentive  Affect:  Appropriate  Cognitive:  Alert  Insight: Appropriate  Engagement in Group:  Engaged and Supportive  Modes of Intervention:  Discussion   Additional Comments:     Maglione,Brittanyann Wittner E 12/12/2022, 9:11 PM

## 2022-12-13 DIAGNOSIS — T424X2A Poisoning by benzodiazepines, intentional self-harm, initial encounter: Secondary | ICD-10-CM | POA: Diagnosis not present

## 2022-12-13 MED ORDER — NICOTINE POLACRILEX 4 MG MT GUM: 4.0000 mg | CHEWING_GUM | OROMUCOSAL | 0 refills | Status: AC | PRN

## 2022-12-13 MED ORDER — METHOCARBAMOL 750 MG PO TABS: 750.0000 mg | ORAL_TABLET | Freq: Four times a day (QID) | ORAL | 0 refills | Status: AC | PRN

## 2022-12-13 MED ORDER — TRAZODONE HCL 50 MG PO TABS: 50.0000 mg | ORAL_TABLET | Freq: Every day | ORAL | 1 refills | Status: AC

## 2022-12-13 MED ORDER — ADULT MULTIVITAMIN W/MINERALS CH: 1.0000 | ORAL_TABLET | Freq: Every day | ORAL | 0 refills | Status: AC

## 2022-12-13 MED ORDER — NICOTINE 14 MG/24HR TD PT24: 14.0000 mg | MEDICATED_PATCH | Freq: Every day | TRANSDERMAL | 0 refills | Status: AC

## 2022-12-13 MED ORDER — FLUOXETINE HCL 20 MG PO CAPS: 20.0000 mg | ORAL_CAPSULE | Freq: Every day | ORAL | 1 refills | Status: AC

## 2022-12-13 MED ORDER — FOLIC ACID 1 MG PO TABS: 1.0000 mg | ORAL_TABLET | Freq: Every day | ORAL | 0 refills | Status: AC

## 2022-12-13 MED ORDER — CLONAZEPAM 0.5 MG PO TBDP: 0.5000 mg | ORAL_TABLET | Freq: Every day | ORAL | 0 refills | Status: AC

## 2022-12-13 MED ORDER — LAMOTRIGINE 25 MG PO TABS: 25.0000 mg | ORAL_TABLET | Freq: Two times a day (BID) | ORAL | 0 refills | Status: AC

## 2022-12-13 NOTE — Plan of Care (Signed)

## 2022-12-13 NOTE — Progress Notes (Signed)
Patient ID: Dustin Morgan, male   DOB: 29-Oct-2000, 22 y.o.   MRN: [id number]  Discharge Note:  Patient denies SI/HI/AVH at this time. Discharge instructions, AVS, prescriptions, and transition record gone over with patient. Patient given a copy of his Suicide Safety Plan. Patient agrees to comply with medication management, follow-up visit, and outpatient therapy. Patient belongings returned to patient. Patient questions and concerns addressed and answered. Patient ambulatory off unit. Patient discharged to home with his Mom.

## 2022-12-13 NOTE — Discharge Summary (Signed)
Physician Discharge Summary Note  Patient:  Dustin Morgan is an 22 y.o., male MRN:  [id number] DOB:  [birth year] Patient phone:  [phone] (home)  Patient address:   [address] [place] [address],  Total Time spent with patient: 2.5 hours  Date of Admission:  12/08/2022 Date of Discharge: 12/13/2022  Reason for Admission:  22 year old Caucasian male, presented to the ED under an involuntary commitment (IVC) following a suicide attempt. He ingested 30 Xanax tablets (1 mg each) after a verbal altercation with his significant other and initially attempted to overdose on fentanyl and xylazine, but his significant other administered Narcan to revive him. He reports that he engaged in this overdose to "prove a point." He  denies any current suicidal or homicidal ideations, auditory/visual hallucinations, and reports he feels back to his baseline. He expresses a desire to discontinue all illegal drug use, acknowledging the negative impact it has had on his life. He has a history of injecting crack cocaine, heroin, xylazine, and fentanyl daily.    Principal Problem: Suicide attempt by benzodiazepine overdose [facility]) Discharge Diagnoses: Principal Problem:   Suicide attempt by benzodiazepine overdose (HCC) Active Problems:   Cocaine abuse (HCC)   Opiate abuse, continuous (HCC)   Benzodiazepine (tranquilizer) overdose   Past Psychiatric History: IVDA Anxiety Depression  Past Medical History:  Past Medical History:  Diagnosis Date   IV drug user     Past Surgical History:  Procedure Laterality Date   TONSILLECTOMY     Family History: History reviewed. No pertinent family history. Family Psychiatric  History:  Social History:  Social History   Substance and Sexual Activity  Alcohol Use Yes   Comment: occ     Social History   Substance and Sexual Activity  Drug Use Yes   Types: Marijuana, IV, Fentanyl, Oxycodone, Heroin   Comment: crack (smoke), fentanyl (IV), heroine  (IV)    Social History   Socioeconomic History   Marital status: Single    Spouse name: Not on file   Number of children: Not on file   Years of education: Not on file   Highest education level: Not on file  Occupational History   Not on file  Tobacco Use   Smoking status: Every Day    Current packs/day: 1.50    Types: Cigarettes   Smokeless tobacco: Never  Vaping Use   Vaping status: Some Days  Substance and Sexual Activity   Alcohol use: Yes    Comment: occ   Drug use: Yes    Types: Marijuana, IV, Fentanyl, Oxycodone, Heroin    Comment: crack (smoke), fentanyl (IV), heroine (IV)   Sexual activity: Yes  Other Topics Concern   Not on file  Social History Narrative   Not on file   Social Determinants of Health   Financial Resource Strain: Not on file  Food Insecurity: No Food Insecurity (12/08/2022)   Hunger Vital Sign    Worried About Running Out of Food in the Last Year: Never true    Ran Out of Food in the Last Year: Never true  Transportation Needs: No Transportation Needs (12/08/2022)   PRAPARE - Administrator, Civil Service (Medical): No    Lack of Transportation (Non-Medical): No  Physical Activity: Not on file  Stress: Not on file  Social Connections: Not on file    Hospital Course:  22 year old Caucasian male, presented to the ED under involuntary commitment (IVC) from [facility] police after a suicide attempt involving the ingestion  of 30 Xanax tablets (1 mg each). This attempt followed an initial overdose with fentanyl and xylazine, for which his significant other administered Narcan, leading him to then ingest the Xanax. He reported obtaining the Xanax from an "[place]" and denied any active suicidal or homicidal ideation upon presentation. His family called the police for a wellness check, and indicated a desire to discontinue all illicit drug use.During his hospitalization, He experienced mild withdrawal symptoms, including chills,  cramping abdominal pain, sweating, and tremors. He displayed a seizure-like movement briefly, managed by nursing staff, and was given oral Klonopin to alleviate symptoms. He reported an overall improvement in symptoms and denied any nausea, vomiting, or diarrhea. Psychiatric evaluation revealed no active suicidal ideation, homicidal ideation, or psychotic symptoms. His mood stabilized over his stay, and he expressed a goal to resume work and make life changes to avoid future drug use.    Musculoskeletal: Strength & Muscle Tone: within normal limits Gait & Station: normal Patient leans: N/A   Psychiatric Specialty Exam:  Presentation  General Appearance:  Appropriate for Environment; Neat  Eye Contact: Good  Speech: Clear and Coherent; Normal Rate  Speech Volume: Normal  Handedness: Right   Mood and Affect  Mood: Euthymic  Affect: Appropriate; Congruent   Thought Process  Thought Processes: Coherent  Descriptions of Associations:Intact  Orientation:Full (Time, Place and Person) (and situation)  Thought Content:WDL  History of Schizophrenia/Schizoaffective disorder:No data recorded Duration of Psychotic Symptoms:No data recorded Hallucinations:Hallucinations: None  Ideas of Reference:None  Suicidal Thoughts:Suicidal Thoughts: No SI Passive Intent and/or Plan: -- (denies)  Homicidal Thoughts:Homicidal Thoughts: No   Sensorium  Memory: Immediate Good; Remote Good  Judgment: Fair  Insight: Fair   Art therapist  Concentration: Good  Attention Span: Good  Recall: Good  Fund of Knowledge: Good  Language: Good   Psychomotor Activity  Psychomotor Activity: Psychomotor Activity: Normal   Assets  Assets: Communication Skills; Desire for Improvement; Financial Resources/Insurance; Resilience; Physical Health; Talents/Skills   Sleep  Sleep: Sleep: Good Number of Hours of Sleep: 7    Physical Exam: Physical Exam Vitals  and nursing note reviewed.  Constitutional:      Appearance: Normal appearance.  HENT:     Head: Normocephalic and atraumatic.     Nose: Nose normal.  Pulmonary:     Effort: Pulmonary effort is normal.  Musculoskeletal:        General: Normal range of motion.     Cervical back: Normal range of motion.  Neurological:     General: No focal deficit present.     Mental Status: He is alert and oriented to person, place, and time.  Psychiatric:        Attention and Perception: Attention and perception normal.        Mood and Affect: Mood and affect normal.        Speech: Speech normal.        Behavior: Behavior normal. Behavior is cooperative.        Thought Content: Thought content normal.        Cognition and Memory: Cognition and memory normal.        Judgment: Judgment normal.    ROS Blood pressure 123/78, pulse 85, temperature 97.7 F (36.5 C), temperature source Oral, resp. rate 19, height 5\' 7"  (1.702 m), weight 50.3 kg, SpO2 100%. Body mass index is 17.35 kg/m.   Social History   Tobacco Use  Smoking Status Every Day   Current packs/day: 1.50   Types: Cigarettes  Smokeless Tobacco Never   Tobacco Cessation:  A prescription for an FDA-approved tobacco cessation medication provided at discharge   Blood Alcohol level:  Lab Results  Component Value Date   [facility] <10 12/07/2022     See Psychiatric Specialty Exam and Suicide Risk Assessment completed by Attending Physician prior to discharge.  Discharge destination:  Home  Is patient on multiple antipsychotic therapies at discharge:  No   Has Patient had three or more failed trials of antipsychotic monotherapy by history:  No  Recommended Plan for Multiple Antipsychotic Therapies: NA   Allergies as of 12/13/2022       Reactions   Fish Allergy Other (See Comments)   Intolerance        Medication List     STOP taking these medications    cloNIDine 0.1 MG tablet Commonly known as: CATAPRES   gabapentin  300 MG capsule Commonly known as: NEURONTIN       TAKE these medications      Indication  Buprenorphine HCl-Naloxone HCl 8-2 MG Film Place 1 Film under the tongue 3 (three) times daily.  Indication: Opioid Dependence   clonazePAM 0.5 MG disintegrating tablet Commonly known as: KLONOPIN Take 1 tablet (0.5 mg total) by mouth daily for 2 doses.  Indication: Simple Partial Seizure   FLUoxetine 20 MG capsule Commonly known as: PROZAC Take 1 capsule (20 mg total) by mouth daily. Start taking on: December 14, 2022  Indication: Depression   folic acid 1 MG tablet Commonly known as: FOLVITE Take 1 tablet (1 mg total) by mouth daily. Start taking on: December 14, 2022  Indication: Anemia From Inadequate Folic Acid   lamoTRIgine 25 MG tablet Commonly known as: LAMICTAL Take 1 tablet (25 mg total) by mouth 2 (two) times daily.  Indication: Depressive Phase of Manic-Depression   methocarbamol 750 MG tablet Commonly known as: ROBAXIN Take 1 tablet (750 mg total) by mouth every 6 (six) hours as needed for muscle spasms.  Indication: Musculoskeletal Pain   multivitamin with minerals Tabs tablet Take 1 tablet by mouth daily. Start taking on: December 14, 2022  Indication: supplement   nicotine 14 mg/24hr patch Commonly known as: NICODERM CQ - dosed in mg/24 hours Place 1 patch (14 mg total) onto the skin daily. Start taking on: December 14, 2022  Indication: Nicotine Addiction   nicotine polacrilex 4 MG gum Commonly known as: NICORETTE Take 1 each (4 mg total) by mouth as needed for smoking cessation.  Indication: Nicotine Addiction   ondansetron 8 MG disintegrating tablet Commonly known as: ZOFRAN-ODT Take 8 mg by mouth 3 (three) times daily.  Indication: Nausea and Vomiting   traZODone 50 MG tablet Commonly known as: DESYREL Take 1 tablet (50 mg total) by mouth at bedtime.  Indication: Abuse or Misuse of Alcohol        Follow-up Information     [facility]. Go to.   Specialty: Urgent Care Why: Open 24/7, No appointment required. To establish care, walk-in  for first time. Contact information: [address] [place] [address] [phone]        [employer] Go on 12/20/2022.   Why: Your appointment is scheduled for with Janalyn Rouse. at 8:00 AM. You need to arrive early in order to fill out paperwork. Contact information: [address] [place] [address] [phone]                 Follow-up recommendations:  Activity:  as tolerated Diet:  heart healthy  Comments:   Suboxone 8-2 mg TID taper Clonazepam 0.5 mg x 2 doses Gabapentin 300 mg TID Prozac 20 mg daily Lamictal 25 mg BID Specialty Behavioral Health: Follow-up at [facility] ([address], [place], [place]) for immediate 24/7 support and walk-in access to establish ongoing care. Scheduled Follow-up at [facility] Services: Appointment on 12/20/2022 at 8:00 AM with Janalyn Rouse for continued care coordination and substance use counseling. Crisis resources 988 Suicide & Crisis Lifeline: Dial or text 988 to connect with trained crisis counselors available 24/7 for confidential support.  [facility]: Located at [address] [place], [place] [address]. Open 24/7 for walk-in services without an appointment. Contact: [phone].  [facility] Mobile Crisis Management: Provides on-site crisis intervention. Call [phone] (available 24/7) [facility] Solution to the Opioid Problem): Offers post-overdose response, harm reduction services, and linkage to treatment. Contact: [phone].   Signed: Myriam Forehand, NP 12/13/2022, 2:46 PM

## 2022-12-13 NOTE — Progress Notes (Signed)
D- Patient alert and oriented. Affect/mood. Denies SI, HI, AVH, and pain at this time. Patient's goal for today, per his self-inventory is " to focus on hopefully leaving Monday because I feel so ready to make these changes I've already made and continue on the outside", in which he will "stay true to my goals and go to NA meetings in 90 days", in order to achieve his goal.  A- Scheduled medications administered to patient, per MD orders. Support and encouragement provided. Routine safety checks conducted every 15 minutes. Patient informed to notify staff with problems or concerns.  R- No adverse drug reactions noted. Patient contracts for safety at this time. Patient compliant with medications and treatment plan. Patient receptive, calm, and cooperative. Patient interacts well with others on the unit. Patient remains safe at this time.

## 2022-12-13 NOTE — [facility] Suicide Risk Assessment (Signed)
[facility] Discharge Suicide Risk Assessment   Principal Problem: Suicide attempt by benzodiazepine overdose [facility]) Discharge Diagnoses: Principal Problem:   Suicide attempt by benzodiazepine overdose (HCC) Active Problems:   Cocaine abuse (HCC)   Opiate abuse, continuous (HCC)   Benzodiazepine (tranquilizer) overdose   Total Time spent with patient: 2.5 hours  Musculoskeletal: Strength & Muscle Tone: within normal limits Gait & Station: normal Patient leans: N/A  Psychiatric Specialty Exam  Presentation  General Appearance:  Appropriate for Environment; Neat  Eye Contact: Good  Speech: Clear and Coherent; Normal Rate  Speech Volume: Normal  Handedness: Right   Mood and Affect  Mood: Euthymic  Duration of Depression Symptoms: No data recorded Affect: Appropriate; Congruent   Thought Process  Thought Processes: Coherent  Descriptions of Associations:Intact  Orientation:Full (Time, Place and Person) (and situation)  Thought Content:WDL  History of Schizophrenia/Schizoaffective disorder:No data recorded Duration of Psychotic Symptoms:No data recorded Hallucinations:Hallucinations: None  Ideas of Reference:None  Suicidal Thoughts:Suicidal Thoughts: No SI Passive Intent and/or Plan: -- (denies)  Homicidal Thoughts:Homicidal Thoughts: No   Sensorium  Memory: Immediate Good; Remote Good  Judgment: Fair  Insight: Fair   Art therapist  Concentration: Good  Attention Span: Good  Recall: Good  Fund of Knowledge: Good  Language: Good   Psychomotor Activity  Psychomotor Activity:Psychomotor Activity: Normal   Assets  Assets: Communication Skills; Desire for Improvement; Financial Resources/Insurance; Resilience; Physical Health; Talents/Skills   Sleep  Sleep: Sleep: Good Number of Hours of Sleep: 7   Physical Exam: Physical Exam Vitals and nursing note reviewed.  HENT:     Head: Normocephalic and atraumatic.      Nose: Nose normal.  Pulmonary:     Effort: Pulmonary effort is normal.  Musculoskeletal:        General: Normal range of motion.     Cervical back: Normal range of motion.  Neurological:     General: No focal deficit present.     Mental Status: He is alert and oriented to person, place, and time.  Psychiatric:        Attention and Perception: Attention and perception normal.        Mood and Affect: Mood and affect normal.        Speech: Speech normal.        Behavior: Behavior normal. Behavior is cooperative.        Thought Content: Thought content normal.        Cognition and Memory: Cognition and memory normal.        Judgment: Judgment normal.    Review of Systems  All other systems reviewed and are negative.  Blood pressure 123/78, pulse 85, temperature 97.7 F (36.5 C), temperature source Oral, resp. rate 19, height 5\' 7"  (1.702 m), weight 50.3 kg, SpO2 100%. Body mass index is 17.35 kg/m.  Mental Status Per Nursing Assessment::   On Admission:  Self-harm behaviors  Demographic Factors:  Male, Caucasian, and Low socioeconomic status  Loss Factors: Loss of significant relationship  Historical Factors: Prior suicide attempts, Family history of mental illness or substance abuse, and Impulsivity  Risk Reduction Factors:   Sense of responsibility to family, Living with another person, especially a relative, Positive social support, Positive therapeutic relationship, and Positive coping skills or problem solving skills  Continued Clinical Symptoms:  Depression:   Impulsivity Alcohol/Substance Abuse/Dependencies Previous Psychiatric Diagnoses and Treatments  Cognitive Features That Contribute To Risk:  None    Suicide Risk:  Minimal: No identifiable suicidal ideation.  Patients presenting with  no risk factors but with morbid ruminations; may be classified as minimal risk based on the severity of the depressive symptoms   Follow-up Information     [facility]. Go to.   Specialty: Urgent Care Why: Open 24/7, No appointment required. To establish care, walk-in  for first time. Contact information: [address] [place] [address] [phone]        [employer] Go on 12/20/2022.   Why: Your appointment is scheduled for with Janalyn Rouse. at 8:00 AM. You need to arrive early in order to fill out paperwork. Contact information: [address] [place] [address] [phone]                 Plan Of Care/Follow-up recommendations:  Activity:  as tolerated Diet:  heart healthy Suboxone 8-2 mg TID taper Clonazepam 0.5 mg  Gabapentin 300 mg TID Prozac 20 mg daily Lamictal 25 mg BID Specialty Behavioral Health: Follow-up at [facility] ([address], [place], [place]) for immediate 24/7 support and walk-in access to establish ongoing care. Scheduled Follow-up at [facility] Services: Appointment on 12/20/2022 at 8:00 AM with Janalyn Rouse for continued care coordination and substance use counseling. Crisis resources 988 Suicide & Crisis Lifeline: Dial or text 988 to connect with trained crisis counselors available 24/7 for confidential support.  [facility]: Located at [address] [place], [place] [address]. Open 24/7 for walk-in services without an appointment. Contact: [phone].  [facility] Mobile Crisis Management: Provides on-site crisis intervention. Call [phone] (available 24/7) [facility] Solution to the Opioid Problem): Offers post-overdose response, harm reduction services, and linkage to treatment. Contact: [phone].  Myriam Forehand, NP 12/13/2022, 2:32 PM

## 2022-12-13 NOTE — Progress Notes (Signed)
  [facility] Adult Case Management Discharge Plan :  Will you be returning to the same living situation after discharge:  Yes,  pt will return home  At discharge, do you have transportation home?: Yes,  pt dad will pick him up  Do you have the ability to pay for your medications: Yes,  [facility]  Release of information consent forms completed and in the chart;  Patient's signature needed at discharge.  Patient to Follow up at:  Follow-up Information     [facility]. Go to.   Specialty: Urgent Care Why: Open 24/7, No appointment required. To establish care, walk-in  for first time. Contact information: [address] [place] [address] [phone]        [employer] Go on 12/20/2022.   Why: Your appointment is scheduled for with Janalyn Rouse. at 8:00 AM. You need to arrive early in order to fill out paperwork. Contact information: [address] [place] [address] [phone]                 Next level of care provider has access to [facility] Link:no  Safety Planning and Suicide Prevention discussed: Cornelis, [place], [phone]     Has patient been referred to the Quitline?: Patient refused referral for treatment  Patient has been referred for addiction treatment: No known substance use disorder.  [address], LCSWA 12/13/2022, 12:03 PM

## 2022-12-13 NOTE — Plan of Care (Signed)
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
## 2022-12-13 NOTE — Progress Notes (Signed)
   12/12/22 2300  Psych Admission Type (Psych Patients Only)  Admission Status Involuntary  Psychosocial Assessment  Patient Complaints Anxiety  Eye Contact Fair  Facial Expression Anxious  Affect Appropriate to circumstance  Speech Logical/coherent  Interaction Assertive  Motor Activity Slow  Appearance/Hygiene Unremarkable  Behavior Characteristics Cooperative  Mood Pleasant  Thought Process  Coherency WDL  Content WDL  Delusions None reported or observed  Perception WDL  Hallucination None reported or observed  Judgment WDL  Confusion None  Danger to Self  Current suicidal ideation? Denies  Agreement Not to Harm Self Yes  Description of Agreement Verbal  Danger to Others  Danger to Others None reported or observed
